# Patient Record
Sex: Female | Born: 1935 | Race: White | Hispanic: No | Smoking: Never smoker
Health system: Southern US, Community
[De-identification: ages and names within clinical notes are randomized; demographics above are authoritative.]

## PROBLEM LIST (undated history)

## (undated) DIAGNOSIS — E785 Hyperlipidemia, unspecified: Secondary | ICD-10-CM

## (undated) DIAGNOSIS — M1990 Unspecified osteoarthritis, unspecified site: Secondary | ICD-10-CM

## (undated) DIAGNOSIS — L97509 Non-pressure chronic ulcer of other part of unspecified foot with unspecified severity: Secondary | ICD-10-CM

## (undated) DIAGNOSIS — E119 Type 2 diabetes mellitus without complications: Secondary | ICD-10-CM

## (undated) DIAGNOSIS — I82409 Acute embolism and thrombosis of unspecified deep veins of unspecified lower extremity: Secondary | ICD-10-CM

## (undated) HISTORY — DX: Non-pressure chronic ulcer of other part of unspecified foot with unspecified severity: L97.509

## (undated) HISTORY — PX: BREAST BIOPSY: SHX20

## (undated) HISTORY — PX: CATARACT EXTRACTION: SUR2

## (undated) HISTORY — PX: BLADDER SURGERY: SHX569

## (undated) HISTORY — DX: Type 2 diabetes mellitus without complications: E11.9

## (undated) HISTORY — DX: Unspecified osteoarthritis, unspecified site: M19.90

## (undated) HISTORY — DX: Hyperlipidemia, unspecified: E78.5

## (undated) HISTORY — PX: URETERAL EXPLORATION: SHX2609

---

## 1965-12-14 HISTORY — PX: ABDOMINAL HYSTERECTOMY: SHX81

## 1983-12-15 HISTORY — PX: OTHER SURGICAL HISTORY: SHX169

## 2004-11-13 ENCOUNTER — Ambulatory Visit: Payer: Self-pay | Admitting: Internal Medicine

## 2005-07-09 ENCOUNTER — Ambulatory Visit: Payer: Self-pay | Admitting: Ophthalmology

## 2005-07-15 ENCOUNTER — Ambulatory Visit: Payer: Self-pay | Admitting: Ophthalmology

## 2005-11-18 ENCOUNTER — Ambulatory Visit: Payer: Self-pay | Admitting: Internal Medicine

## 2006-07-10 ENCOUNTER — Emergency Department: Payer: Self-pay | Admitting: Emergency Medicine

## 2006-07-10 ENCOUNTER — Other Ambulatory Visit: Payer: Self-pay

## 2006-11-29 ENCOUNTER — Ambulatory Visit: Payer: Self-pay | Admitting: Internal Medicine

## 2007-03-22 ENCOUNTER — Ambulatory Visit: Payer: Self-pay | Admitting: Gastroenterology

## 2007-07-04 ENCOUNTER — Other Ambulatory Visit: Payer: Self-pay

## 2007-07-04 ENCOUNTER — Ambulatory Visit: Payer: Self-pay | Admitting: Emergency Medicine

## 2007-07-04 ENCOUNTER — Emergency Department: Payer: Self-pay | Admitting: Emergency Medicine

## 2007-12-28 ENCOUNTER — Ambulatory Visit: Payer: Self-pay | Admitting: Family Medicine

## 2009-01-02 ENCOUNTER — Ambulatory Visit: Payer: Self-pay | Admitting: Family Medicine

## 2009-02-21 ENCOUNTER — Ambulatory Visit: Payer: Self-pay | Admitting: Family Medicine

## 2009-11-28 ENCOUNTER — Inpatient Hospital Stay: Payer: Self-pay | Admitting: Specialist

## 2010-01-07 ENCOUNTER — Ambulatory Visit: Payer: Self-pay | Admitting: Family Medicine

## 2010-05-08 ENCOUNTER — Ambulatory Visit: Payer: Self-pay | Admitting: Gastroenterology

## 2011-01-08 ENCOUNTER — Ambulatory Visit: Payer: Self-pay | Admitting: Family Medicine

## 2012-02-16 ENCOUNTER — Ambulatory Visit: Payer: Self-pay | Admitting: Internal Medicine

## 2012-02-22 DIAGNOSIS — Z7901 Long term (current) use of anticoagulants: Secondary | ICD-10-CM | POA: Insufficient documentation

## 2012-02-22 DIAGNOSIS — E559 Vitamin D deficiency, unspecified: Secondary | ICD-10-CM | POA: Insufficient documentation

## 2012-03-22 DIAGNOSIS — I999 Unspecified disorder of circulatory system: Secondary | ICD-10-CM | POA: Insufficient documentation

## 2012-12-14 HISTORY — PX: PARTIAL HIP ARTHROPLASTY: SHX733

## 2013-02-16 ENCOUNTER — Ambulatory Visit: Payer: Self-pay | Admitting: Internal Medicine

## 2013-04-25 DIAGNOSIS — I6529 Occlusion and stenosis of unspecified carotid artery: Secondary | ICD-10-CM | POA: Insufficient documentation

## 2013-04-26 ENCOUNTER — Ambulatory Visit: Payer: Self-pay | Admitting: Internal Medicine

## 2013-05-31 DIAGNOSIS — R739 Hyperglycemia, unspecified: Secondary | ICD-10-CM | POA: Insufficient documentation

## 2013-08-04 ENCOUNTER — Ambulatory Visit: Payer: Self-pay | Admitting: Orthopedic Surgery

## 2013-08-04 LAB — URINALYSIS, COMPLETE
Bacteria: NONE SEEN
Bilirubin,UR: NEGATIVE
Ketone: NEGATIVE
Leukocyte Esterase: NEGATIVE
Nitrite: NEGATIVE
Ph: 6 (ref 4.5–8.0)
RBC,UR: NONE SEEN /HPF (ref 0–5)
Specific Gravity: 1.01 (ref 1.003–1.030)

## 2013-08-04 LAB — PROTIME-INR
INR: 1.7
Prothrombin Time: 20.2 secs — ABNORMAL HIGH (ref 11.5–14.7)

## 2013-08-04 LAB — CBC
HCT: 36.5 % (ref 35.0–47.0)
HGB: 12.3 g/dL (ref 12.0–16.0)
MCHC: 33.7 g/dL (ref 32.0–36.0)
MCV: 94 fL (ref 80–100)
Platelet: 194 10*3/uL (ref 150–440)
RDW: 12.5 % (ref 11.5–14.5)
WBC: 9.4 10*3/uL (ref 3.6–11.0)

## 2013-08-04 LAB — COMPREHENSIVE METABOLIC PANEL
Albumin: 3.4 g/dL (ref 3.4–5.0)
Alkaline Phosphatase: 69 U/L (ref 50–136)
Anion Gap: 6 — ABNORMAL LOW (ref 7–16)
BUN: 27 mg/dL — ABNORMAL HIGH (ref 7–18)
Bilirubin,Total: 0.2 mg/dL (ref 0.2–1.0)
Calcium, Total: 9.3 mg/dL (ref 8.5–10.1)
Co2: 28 mmol/L (ref 21–32)
EGFR (African American): >60
Glucose: 114 mg/dL — ABNORMAL HIGH (ref 65–99)
Osmolality: 284 (ref 275–301)
Potassium: 4.7 mmol/L (ref 3.5–5.1)
SGOT(AST): 19 U/L (ref 15–37)
SGPT (ALT): 21 U/L (ref 12–78)
Sodium: 139 mmol/L (ref 136–145)

## 2013-08-05 ENCOUNTER — Inpatient Hospital Stay: Payer: Self-pay | Admitting: Internal Medicine

## 2013-08-05 LAB — PROTIME-INR
INR: 1.7
INR: 1.6
Prothrombin Time: 18.8 secs — ABNORMAL HIGH (ref 11.5–14.7)

## 2013-08-05 LAB — APTT: Activated PTT: 29.3 secs (ref 23.6–35.9)

## 2013-08-06 LAB — PROTIME-INR
INR: 1.3
Prothrombin Time: 16.2 secs — ABNORMAL HIGH (ref 11.5–14.7)

## 2013-08-06 LAB — CBC WITH DIFFERENTIAL/PLATELET
Basophil #: 0 10*3/uL (ref 0.0–0.1)
Basophil %: 0.3 %
Eosinophil %: 2.5 %
HCT: 34.4 % — ABNORMAL LOW (ref 35.0–47.0)
Lymphocyte #: 1.8 10*3/uL (ref 1.0–3.6)
Lymphocyte %: 16.7 %
MCH: 32.3 pg (ref 26.0–34.0)
MCHC: 33.9 g/dL (ref 32.0–36.0)
MCV: 96 fL (ref 80–100)
Neutrophil #: 7.3 10*3/uL — ABNORMAL HIGH (ref 1.4–6.5)
Neutrophil %: 67.9 %
Platelet: 156 10*3/uL (ref 150–440)
RBC: 3.6 10*6/uL — ABNORMAL LOW (ref 3.80–5.20)
RDW: 13.2 % (ref 11.5–14.5)
WBC: 10.8 10*3/uL (ref 3.6–11.0)

## 2013-08-06 LAB — BASIC METABOLIC PANEL
BUN: 14 mg/dL (ref 7–18)
Creatinine: 0.81 mg/dL (ref 0.60–1.30)
EGFR (Non-African Amer.): >60
Osmolality: 275 (ref 275–301)
Potassium: 4.2 mmol/L (ref 3.5–5.1)

## 2013-08-07 LAB — BASIC METABOLIC PANEL
Anion Gap: 4 — ABNORMAL LOW (ref 7–16)
BUN: 12 mg/dL (ref 7–18)
Calcium, Total: 8.5 mg/dL (ref 8.5–10.1)
Chloride: 103 mmol/L (ref 98–107)
Co2: 31 mmol/L (ref 21–32)
Creatinine: 0.75 mg/dL (ref 0.60–1.30)
EGFR (African American): >60
EGFR (Non-African Amer.): >60
Glucose: 96 mg/dL (ref 65–99)
Potassium: 4.1 mmol/L (ref 3.5–5.1)

## 2013-08-07 LAB — HEMOGLOBIN: HGB: 10.1 g/dL — ABNORMAL LOW (ref 12.0–16.0)

## 2013-08-07 LAB — PROTIME-INR
INR: 1.3
Prothrombin Time: 16.5 secs — ABNORMAL HIGH (ref 11.5–14.7)

## 2013-08-08 LAB — PROTIME-INR: Prothrombin Time: 19.2 secs — ABNORMAL HIGH (ref 11.5–14.7)

## 2013-08-08 LAB — PLATELET COUNT: Platelet: 128 x10 3/mm 3 — ABNORMAL LOW

## 2013-08-09 LAB — URINALYSIS, COMPLETE
Bilirubin,UR: NEGATIVE
Glucose,UR: NEGATIVE mg/dL
Ketone: NEGATIVE
Leukocyte Esterase: NEGATIVE
Nitrite: NEGATIVE
Ph: 5
Protein: NEGATIVE
RBC,UR: 6 /HPF
Specific Gravity: 1.012
Squamous Epithelial: 4
WBC UR: 2 /HPF

## 2013-08-09 LAB — PROTIME-INR
INR: 1.7
Prothrombin Time: 19.5 s — ABNORMAL HIGH

## 2013-08-09 LAB — PATHOLOGY REPORT

## 2013-08-09 LAB — HEMOGLOBIN: HGB: 8.8 g/dL — ABNORMAL LOW (ref 12.0–16.0)

## 2013-08-10 LAB — CBC WITH DIFFERENTIAL/PLATELET
Basophil #: 0.1 10*3/uL (ref 0.0–0.1)
Eosinophil #: 0.4 10*3/uL (ref 0.0–0.7)
Eosinophil %: 4.6 %
HCT: 24.7 % — ABNORMAL LOW (ref 35.0–47.0)
HGB: 8.6 g/dL — ABNORMAL LOW (ref 12.0–16.0)
Lymphocyte #: 1.9 10*3/uL (ref 1.0–3.6)
MCHC: 34.8 g/dL (ref 32.0–36.0)
MCV: 92 fL (ref 80–100)
Neutrophil #: 4.4 10*3/uL (ref 1.4–6.5)
Neutrophil %: 55.5 %
Platelet: 184 10*3/uL (ref 150–440)
RBC: 2.67 10*6/uL — ABNORMAL LOW (ref 3.80–5.20)
RDW: 12.4 % (ref 11.5–14.5)
WBC: 7.9 10*3/uL (ref 3.6–11.0)

## 2013-08-10 LAB — PROTIME-INR
INR: 2.2
Prothrombin Time: 24.3 secs — ABNORMAL HIGH (ref 11.5–14.7)

## 2013-08-14 LAB — CULTURE, BLOOD (SINGLE)

## 2013-10-03 DIAGNOSIS — S72001A Fracture of unspecified part of neck of right femur, initial encounter for closed fracture: Secondary | ICD-10-CM | POA: Insufficient documentation

## 2013-11-21 ENCOUNTER — Ambulatory Visit: Payer: Self-pay | Admitting: Family Medicine

## 2013-12-14 HISTORY — PX: OTHER SURGICAL HISTORY: SHX169

## 2014-02-25 ENCOUNTER — Emergency Department: Payer: Self-pay | Admitting: Emergency Medicine

## 2014-03-12 ENCOUNTER — Ambulatory Visit: Payer: Self-pay | Admitting: Nurse Practitioner

## 2014-04-03 DIAGNOSIS — S42309A Unspecified fracture of shaft of humerus, unspecified arm, initial encounter for closed fracture: Secondary | ICD-10-CM | POA: Insufficient documentation

## 2014-04-30 ENCOUNTER — Ambulatory Visit: Payer: Self-pay | Admitting: Family Medicine

## 2014-06-21 ENCOUNTER — Ambulatory Visit: Payer: Self-pay | Admitting: Family Medicine

## 2014-06-27 ENCOUNTER — Encounter: Payer: Self-pay | Admitting: General Surgery

## 2014-07-02 ENCOUNTER — Encounter: Payer: Self-pay | Admitting: General Surgery

## 2014-07-02 ENCOUNTER — Ambulatory Visit (INDEPENDENT_AMBULATORY_CARE_PROVIDER_SITE_OTHER): Payer: Medicare HMO | Admitting: General Surgery

## 2014-07-02 VITALS — BP 130/72 | HR 76 | Resp 14 | Wt 187.0 lb

## 2014-07-02 DIAGNOSIS — I872 Venous insufficiency (chronic) (peripheral): Secondary | ICD-10-CM

## 2014-07-02 DIAGNOSIS — I83893 Varicose veins of bilateral lower extremities with other complications: Secondary | ICD-10-CM

## 2014-07-02 NOTE — Progress Notes (Signed)
Patient ID: Molly Hawkins, female   DOB: [birth year], 78 y.o.   MRN: [id number]  Chief Complaint  Patient presents with  . Other    left leg pain    HPI Molly Hawkins is a 78 y.o. female here today for left leg pain for about 1 month. Patient states she broke her arm 4 months ago. She states she has been having troubling with her left leg swelling.Patient states she had a ultrasound on 06/21/14 left leg. She states she wear compassion stockings daily.Patient has been on warfarin for 30 years now.She was treated 30 years ago to due blood clots when she broke her ankle.  HPI  Past Medical History  Diagnosis Date  . Diabetes mellitus without complication   . Hyperlipidemia   . Arthritis   . Ulcer of foot     Past Surgical History  Procedure Laterality Date  . Abdominal hysterectomy  1967  . Broken ankle  1985  . Cataract extraction  [id number]  . Partial hip arthroplasty  2014  . Broken arm Left 2015    History reviewed. No pertinent family history.  Social History History  Substance Use Topics  . Smoking status: Never Smoker   . Smokeless tobacco: Never Used  . Alcohol Use: No    Allergies  Allergen Reactions  . Codeine     Stomach problems    Current Outpatient Prescriptions  Medication Sig Dispense Refill  . CALCIUM CITRATE-VITAMIN D3 PO Take 1 tablet by mouth daily at 6 (six) AM.      . lisinopril (PRINIVIL,ZESTRIL) 20 MG tablet Take 1 tablet by mouth daily.      . vitamin B-12 (CYANOCOBALAMIN) 100 MCG tablet Take 100 mcg by mouth daily.      Marland Kitchen warfarin (COUMADIN) 3 MG tablet Take 1 tablet by mouth 4 (four) times a week.      . warfarin (COUMADIN) 4 MG tablet Take 4 mg by mouth 3 (three) times a week.       No current facility-administered medications for this visit.    Review of Systems Review of Systems  Constitutional: Negative.   Cardiovascular: Positive for leg swelling. Negative for chest pain and palpitations.    Blood pressure 130/72, pulse 76,  resp. rate 14, weight 187 lb (84.823 kg).  Physical Exam Physical Exam  Constitutional: She is oriented to person, place, and time. She appears well-developed and well-nourished.  Eyes: Conjunctivae are normal. No scleral icterus.  Neck: Neck supple.  Cardiovascular: Normal rate, regular rhythm and normal heart sounds.   Pulses:      Dorsalis pedis pulses are 1+ on the right side, and 0 on the left side.       Posterior tibial pulses are 2+ on the right side, and 2+ on the left side.  Left leg mildly tender and pitting edema . Less on right leg. Right calf VV. Statistics changes left leg above ankle inner asp  With tenderness and induration       Pulmonary/Chest: Effort normal and breath sounds normal.  Abdominal: Soft. Bowel sounds are normal. There is no tenderness.  Lymphadenopathy:    She has no cervical adenopathy.    She has no axillary adenopathy.       Right: No inguinal adenopathy present.       Left: No inguinal adenopathy present.  Neurological: She is alert and oriented to person, place, and time.  Skin: Skin is warm and dry.    Data Reviewed Venous Duplex study-  clots in distal left FV, likely old.  Assessment    Do not think pt has new clot in left leg, given prior history of DVT, pt anticoagulated. Findings are consistent with chronic venous insufficiency.    Plan    Patient to contine to use her compassion stockings. Patient to return on six week will repeat US to assess for reflux.           Zeeshan Korte G 07/02/2014, 8:17 PM

## 2014-07-02 NOTE — Patient Instructions (Addendum)
Patient to contine to use her compassion stockings. Patient to return on six week ultrasound. Recommended use of udder cream to leg to minimize stasis changes.

## 2014-07-11 ENCOUNTER — Ambulatory Visit: Payer: Self-pay | Admitting: Internal Medicine

## 2014-07-11 LAB — CBC CANCER CENTER
BASOS ABS: 0.1 x10 3/mm (ref 0.0–0.1)
Basophil %: 1 %
Eosinophil #: 0.3 x10 3/mm (ref 0.0–0.7)
Eosinophil %: 4.5 %
HCT: 39.7 % (ref 35.0–47.0)
HGB: 12.9 g/dL (ref 12.0–16.0)
LYMPHS ABS: 3.4 x10 3/mm (ref 1.0–3.6)
Lymphocyte %: 47.6 %
MCH: 30 pg (ref 26.0–34.0)
MCHC: 32.4 g/dL (ref 32.0–36.0)
MCV: 93 fL (ref 80–100)
MONO ABS: 0.8 x10 3/mm (ref 0.2–0.9)
Monocyte %: 11.3 %
NEUTROS ABS: 2.6 x10 3/mm (ref 1.4–6.5)
NEUTROS PCT: 35.6 %
PLATELETS: 197 x10 3/mm (ref 150–440)
RBC: 4.29 10*6/uL (ref 3.80–5.20)
RDW: 14.1 % (ref 11.5–14.5)
WBC: 7.2 x10 3/mm (ref 3.6–11.0)

## 2014-07-11 LAB — PROTIME-INR
INR: 2.2
Prothrombin Time: 24 secs — ABNORMAL HIGH (ref 11.5–14.7)

## 2014-07-14 ENCOUNTER — Ambulatory Visit: Payer: Self-pay | Admitting: Internal Medicine

## 2014-08-13 ENCOUNTER — Ambulatory Visit: Payer: Medicare HMO | Admitting: General Surgery

## 2014-08-14 ENCOUNTER — Ambulatory Visit: Payer: Self-pay | Admitting: Internal Medicine

## 2014-08-30 ENCOUNTER — Encounter: Payer: Self-pay | Admitting: *Deleted

## 2014-10-04 DIAGNOSIS — C44310 Basal cell carcinoma of skin of unspecified parts of face: Secondary | ICD-10-CM | POA: Insufficient documentation

## 2014-10-15 ENCOUNTER — Encounter: Payer: Self-pay | Admitting: General Surgery

## 2015-04-05 NOTE — Discharge Summary (Signed)
PATIENT NAME:  Molly Hawkins, Molly Hawkins MR#:  [id number] DATE OF BIRTH:  [birth year]  DATE OF ADMISSION:  08/05/2013 DATE OF DISCHARGE:  08/09/2013  ADMITTING DIAGNOSIS: Displaced right femoral neck fracture   DISCHARGE DIAGNOSES:   1.  Displaced right femoral neck fracture, status post right hip hemiarthroplasty on the 08/06/2013 by Dr. Tamala Julian.  2.  Coagulopathy due to Coumadin status post reversal for operation with fresh frozen plasma.  3.  Acute respiratory failure, postoperatively, likely aspiration. Questionble pneumonia. No pulmonary embolism on CT. Now on 2 liters of oxygen through nasal cannula.  4.  Acute posthemorrhagic anemia noted postoperatively with hemoglobin level of 8.8.  5.  History of hypertension. 6.  Diabetes mellitus, diet controlled. 7.  History of left lower extremity deep vein thrombosis now being loaded back with Coumadin with INR of 1.7 today on the 08/09/2013. 8.  History of arthritis.   DISCHARGE CONDITION: Stable.   DISCHARGE MEDICATIONS: The patient is to continue her usual medications.  1.  Coumadin 4 mg p.o. on Sundays, Mondays, Tuesdays, Thursdays, Fridays as well as Saturdays and 3 mg on Wednesdays and follow her pro time daily and adjusting Coumadin depending on the need.  2.  Lisinopril 20 mg p.o. daily. Better to be given at nighttime due to relative hypotension.  3.  Acetaminophen 500 mg p.o. every 4 hours as needed.  4.  Oxycodone 5 mg every 4 hours as needed.  5.  Nystatin topical powder to affected area 3 times daily.  6.  Docusate senna 50/8.6 mg 1 tablet twice a day.  7.  Bisacodyl 10 mg rectally 1 suppository once daily as needed.  8.  Levofloxacin 250 mg p.o. daily for 6 more days.  9.  Home oxygen with 2 liters of oxygen through nasal cannula in portable tank.   DIET: 2 grams salt, low fat, low cholesterol, carbohydrate-controlled diet, regular consistency.   ACTIVITY LIMITATIONS: As tolerated.  Physical therapy per surgery.   FOLLOWUP  APPOINTMENTS:   (Dictation Anomaly) SNF  [facility] in 2 days after discharge as well as [facility] in 1 to 2 weeks after discharge.   CONSULTANTS:  Medical service, care management, social work, Reche Dixon, [place], Dr. Mali Smith, [facility] ortho.    RADIOLOGIC STUDIES: Chest x-ray, 1 view on the 08/04/2013 showed no acute cardiopulmonary disease. No acute bony abnormality, no pneumothorax. Right hip complete x-ray 08/04/2013, showed angulated right femoral neck fracture. Pelvis AP only 08/04/2013, showed right femoral neck angulated fracture. Pelvic AP only 08/06/2013, postoperatively in PACU, newly placed prosthetic right hip joint. Further interpretation was deferred to Dr. Mali Smith. Chest, portable single view 08/07/2013, showed no acute disease of the chest. CT scan of the chest with IV contrast 08/09/2023, showed no CT evidence of pulmonary arterial embolic disease. Findings consistent with interstitial lung disease changes at the lung bases, atelectasis versus infiltrates at lung bases. Trace bilateral effusions were also noted.   HISTORY OF PRESENT ILLNESS: The patient is 79 year old Caucasian female with past medical history significant for history of diabetes mellitus diet controlled, history of hypertension, as well as DVT who was on Coumadin therapy at home, presents to the hospital after a fall with right hip pain. Please refer to Dr. Mali Smith's admission on 08/04/2013. On arrival to the Emergency Room, the patient was noted to have displaced right femoral neck fracture.  Dr. Mali Smith saw the patient in consultation and recommended to admit the patient to the hospitalist services due to hip fracture and undergo  right hemiarthroplasty. As the patient's INR was found to be 1.7, Dr. Tamala Julian felt that he would be able to proceed to operating room as soon as the patient's INR is controlled, and below 1.5. The patient was admitted to the hospital for further evaluation and treatment.   LABORATORY DATA: Initial labs  on 08/04/2013 showed mild elevation of BUN to 27, glucose 114, otherwise BMP was unremarkable. Liver enzymes were normal. Cardiac enzymes were not done. CBC was within normal limits.  INR as mentioned above was 1.7. Urinalysis was unremarkable. EKG showed normal sinus rhythm at 69 beats per minute, possible left atrial enlargement, ST elevation. Consider early repolarization according to EKG criteria. Borderline EKG, but no significant change since December 2010.   HOSPITAL COURSE:  The patient was given fresh frozen plasma 1 unit after which the patient's pro time improved to 1.3 and the patient was taken to the operating room on the 08/06/2013 where she underwent right hip hemiarthroplasty under spinal anesthesia by Dr. Mali Smith due to displaced right femoral neck fracture. Postoperatively she did well; however, she was noted to be hypoxic on 08/07/2013 with oxygen saturations as low as 50s to 70s on room air. She was placed on oxygen therapy. Chest x-ray were done. Chest x-ray was unremarkable; however, the patient had a CT scan of her chest done to rule out pulmonary embolism which showed atelectasis versus pneumonia as well as trace bilateral effusions.  IV fluids were stopped and the patient was started on incentive spirometry as well as antibiotic therapy. She, however, did not have any significant changes such as fevers or sputum production during all her stay in the hospital.     1.  In regards to a hip fracture, the patient is to continue her physical therapy according to [facility] ortho.  Discharge recommendations: The patient is to continue increasing weight-bearing on the affected extremity.  Change dressing once daily or as needed. The patient is to follow up with [facility] orthopedics in the next few weeks.  After procedure, the patient had her Coumadin resumed and the patient was continued on Lovenox twice a day. She is being discharged on Coumadin therapy, which should be resumed at her home doses. Her pro  time today on 08/ 27/2014, is 19.5 with INR of 1.7. It is recommended to follow the patient's pro time levels daily and adjust the Coumadin therapy as needed to reach a level of 2.0 and above.   2.  In regards to acute respiratory failure. The patient is to continue antibiotic therapy as well as incentive spirometry. She is to continue oxygen therapy and wean her off oxygen as tolerated to keep her pulse oximetry around 92% and above.  3. The patient was noted to have acute posthemorrhagic anemia. Postoperatively her hemoglobin level dropped down from admission level of 12.3 to 8.8 on 08/08/2013. It remained; however, stable by the 08/09/2013. It is recommended to continue observing her hemoglobin level and initiated her on iron supplementation if needed. 4.  For history of hypertension, the patient was relatively hypotensive while she was in the hospital; however on the day of discharge her blood pressure is around 120 to 127.  We recommended to restart her usual doses of lisinopril upon discharge.  5.  In regards to diabetes mellitus, the patient claimed that her hemoglobin A1c was around 6. She is to continue diabetic diet for now.  6.  History of deep vein thrombosis. The patient is as mentioned above being loaded with Coumadin  again. Her INR is 1.7. It is recommended to follow her pro time and adjust medications.  7. Arthritis. She is to continue pain management.   She is being discharged in stable condition with the above-mentioned medications as well as followup.   VITAL SIGNS:  Temperature is 97.8, pulse is 75, respirations 18, blood pressure 124/72, saturation was 92% on 2 liters of oxygen through nasal cannula.    TIME SPENT: 40 minutes.   ____________________________ Theodoro Grist, MD rv:dp D: 08/09/2013 08:54:00 ET T: 08/09/2013 09:37:19 ET JOB#: [id number]  cc: [facility] Theodoro Grist, MD, <Dictator>     [place] MD ELECTRONICALLY SIGNED 09/02/2013 18:11

## 2015-04-05 NOTE — Consult Note (Signed)
Brief Consult Note: Diagnosis: Right femur fracture.   Patient was seen by consultant.   Consult note dictated.   Recommend to proceed with surgery or procedure.   Recommend further assessment or treatment.   Orders entered.   Discussed with Attending MD.   Comments: 1. Right femur fracture Mechanical fall. Low risk for surgery with good functional status. Have to make sure INR is followed up tomorrow to keep it < 1.5. Hold coumadin.  2. h/o DVT Lovenox after surgery  FULL CODE.  Electronic Signatures: Alba Destine (MD)  (Signed [phone] 21:49)  Authored: Brief Consult Note   Last Updated: 22-Aug-14 21:49 by Alba Destine (MD)

## 2015-04-05 NOTE — Discharge Summary (Signed)
PATIENT NAME:  Molly Hawkins, Molly Hawkins MR#:  [id number] DATE OF BIRTH:  [birth year]  DATE OF ADMISSION:  08/05/2013 DATE OF DISCHARGE:   08/10/2013  Addendum  The patient was about to be discharged on  08/09/2013; however, she was noticed to have a high fever, as high as 102.4 on 08/08/2013, at around 10:00 p.m., so further investigation of this fever was undertaken as the patient was held in the hospital. Blood cultures were performed on 08/09/2013, and those were negative. No growth in 8 to 12 hours. Urinalysis was also done, which showed yellow, hazy urine. Negative for glucose, bilirubin or ketones. Specific gravity was 1.012, pH was 5.0, 1+ blood, negative for protein, nitrites or leukocyte esterase, 6 red blood cells, 2 white blood cells, 1+ bacteria, 4 epithelial cells as well as mucus was present. The patient also had a chest x-ray performed on 08/09/2013, which showed no evidence of acute cardiopulmonary disease. It was unclear why the patient was having fevers; however, those fevers have not recurred and so far all workup was negative. It was felt that the patient would be safe to be discharged on Levaquin to continue for suspected aspiration versus atelectasis in her lungs. The patient, however, remains asymptomatic in regards to that, except her hypoxia. Her hypoxia, however, is improving with incentive spirometry as well as Levaquin. On the day of discharge, 08/10/2013, the patient's temperature is 98, pulse was 80, respiration rate was 18, blood pressure 127/65, saturation was 97% on 2 liters of oxygen through nasal cannula. The patient is stable to discharge home today.   In regards to coagulopathy, (Dictation Anomaly) DVT, the patient was loaded with Coumadin. The patient's INR today is therapeutic at 2.2. (Dictation Anomaly) Since her protime is therapeutic, recommend to continue her usual home Coumadin doses and follow her Coumadin level per protocol.   In regards to hypoxia, acute respiratory  failure, it was felt to be due to some element of aspiration as well as atelectasis, questionable pneumonia. The patient is to continue antibiotic therapy as well as incentive spirometry. No pulmonary embolism was noted on the CT scan. The  patient was also noted to be anemic; however, the patient's anemia remains stable on Coumadin therapy. On the day of discharge, 08/10/2013, the patient's hemoglobin level is 8.6, which is about the same as it has been since 08/08/2013.    For diabetes mellitus, the patient's diabetes was diet controlled. The patient is to continue diet.  For hypertension, the patient is to resume her usual doses of lisinopril upon discharge. It is recommended to follow the patient's blood pressure readings as outpatient and make decisions about advancement of lowering the dose of lisinopril. On the day of discharge, the patient's blood pressure is fluctuating between 741 to 287 systolic.   The patient is being discharged in stable condition with the above-mentioned medications and followup.   TIME SPENT: 40 minutes.   ____________________________ Theodoro Grist, MD rv:dmm D: 08/10/2013 10:01:00 ET T: 08/10/2013 10:24:21 ET JOB#: [id number]  cc: Theodoro Grist, MD, <Dictator> Mykeisha Dysert MD ELECTRONICALLY SIGNED 09/06/2013 20:32

## 2015-04-05 NOTE — Consult Note (Signed)
PATIENT NAME:  Molly Hawkins, ZWILLING MR#:  [id number] DATE OF BIRTH:  [birth year]  DATE OF CONSULTATION:  08/04/2013  CONSULTING PHYSICIAN:  Sheilyn Boehlke R. Cali Cuartas, MD   PRIMARY CARE PHYSICIAN:  Valentino Saxon.   CHIEF COMPLAINT: Fall, right hip pain.   HISTORY OF PRESENTING ILLNESS: A 79 year old female patient with history of DVT on Coumadin, hypertension. Was walking barefoot in her home, tripped over a mat and fell down, landing on her right hip. She has an angulated right hip fracture. The patient is being admitted by orthopedic surgeon and preop evaluation and medical management has been requested.   The patient presently complains of right hip pain. Did not hit her head. No loss of consciousness. No syncope, palpitations, shortness of breath, chest pain. She has good functional status. Walks about 30 minutes every day without any shortness of breath or chest pain. She had a normal stress test in 2007. EKG does not show any acute ST-T wave changes. Her INR level is 1.7. She did not take her Coumadin today. CBC and BMP are in the normal range. I have discussed the case with the orthopedic surgeon and ER physician.   PAST MEDICAL HISTORY: 1.  History of left lower extremity DVT, on Coumadin since 1995.  2.  Hypertension.  3.  Diet-controlled diabetes mellitus. The patient did lose a significant amount of weight.  4.  Arthritis.   FAMILY HISTORY: Reviewed, unknown.   ALLERGIES: CILOXAN AND CODEINE.    HOME MEDICATIONS: Include:  1.  Norvasc 5 mg oral daily.  2.  Lisinopril 20 mg daily.  3.  Coumadin 5 mg daily.     REVIEW OF SYSTEMS: CONSTITUTIONAL: No fatigue, weakness, weight loss, weight gain.  EYES: No blurred vision, pain or redness.  ENT: No tinnitus, ear pain, hearing loss.  RESPIRATORY: No cough, wheezing,  hemoptysis.  CARDIOVASCULAR: No chest pain, orthopnea, edema.  GASTROINTESTINAL No nausea, vomiting, diarrhea, abdominal pain.  GENITOURINARY: No dysuria, hematuria, frequency.   ENDOCRINE: No polyuria, nocturia or thyroid problems.  HEMATOLOGIC AND LYMPHATIC: No anemia, easy bruising or bleeding.  INTEGUMENTARY: No acne, rash, lesions.  MUSCULOSKELETAL: Has right hip pain.  NEUROLOGIC: No focal numbness or seizures.  PSYCHIATRIC:  No anxiety or depression.   SOCIAL HISTORY: The patient ambulates on her own. Does not smoke. No alcohol. No illicit drugs. Lives alone.   PHYSICAL EXAMINATION: VITAL SIGNS:  Shows temperature 97.7, pulse 72, blood pressure 158/68, saturating 99% on room air.  GENERAL: Obese, Caucasian female patient, lying in bed, in mild distress secondary to right hip pain.  PSYCHIATRIC: Alert and oriented x 3. Mood and affect appropriate. Judgment intact.  HEENT: Atraumatic, normocephalic. Oral mucosa moist and pink. External ears and nose normal. No pallor. No icterus. Pupils bilaterally equal and reactive to light.  NECK: Supple. No thyromegaly. No palpable lymph nodes. Trachea midline. No carotid bruit, JVD.  CARDIOVASCULAR: S1, S2, without any murmurs. Peripheral pulses 2+.  RESPIRATORY: Normal work of breathing. Clear to auscultation on both sides.  GASTROINTESTINAL:  Soft abdomen, nontender. Bowel sounds present. No hepatosplenomegaly palpable. SKIN: Warm and dry. No petechiae, rash, ulcers.  GENITOURINARY: No CVA tenderness or bladder distention.  MUSCULOSKELETAL: Has tenderness on palpation of the right hip. No swelling. Rotated. No other joint swelling or redness found. Normal muscle tone.  NEUROLOGICAL: Motor strength 5 out of 5 in upper and lower extremities. Sensation to fine touch intact all over. Cranial nerves II through XII intact.  LYMPHATIC: No cervical, supraclavicular lymphadenopathy.   LABORATORY,  DIAGNOSTIC AND RADIOLOGIC DATA:  Show glucose 114, BUN 27, creatinine 0.91, sodium 139, potassium 4.7. AST, ALT, alkaline phosphatase and bilirubin normal. WBC 9.4, hemoglobin 12.3, platelets of 194. Blood group is A negative. INR 1.7.  Urinalysis shows no bacteria.   EKG shows normal sinus rhythm, mild repolarization changes.   Hip x-ray shows right angulated femur fracture.   ASSESSMENT AND PLAN: 1.  Right femur fracture. The patient should be a low risk for surgery with a good functional status, as long as her INR is less than 1.5.  We will repeat an INR tomorrow morning. Reviewed EKG.  Also normal stress test in 2007. Had a mechanical fall today. We will watch our for any acute blood loss anemia. The patient will be on incentive spirometry.  2.  Uncontrolled hypertension, likely secondary from the pain. We will continue her home medications. Add IV hydralazine p.r.n.  3.  Diet-controlled diabetes mellitus. The patient's blood sugar seems to be well controlled. She mentions that HbA1c has been running between 5.5 to 6 at home.  4.  History of deep vein thrombosis. Will need Lovenox after surgery.   CODE STATUS: Full code.   Time Spent today on this case was 45 minutes.    ____________________________ Leia Alf Kori Colin, MD srs:dmm D: 08/04/2013 22:00:00 ET T: 08/04/2013 22:27:34 ET JOB#: [id number]  cc: Alveta Heimlich R. Thatiana Renbarger, MD, <Dictator> Maureen P. Heber [place], MD Neita Carp MD ELECTRONICALLY SIGNED 08/17/2013 8:03

## 2015-04-05 NOTE — H&P (Signed)
PATIENT NAME:  Molly Hawkins, Molly Hawkins MR#:  [id number] DATE OF BIRTH:  [birth year]  DATE OF ADMISSION:  08/04/2013  CHIEF COMPLAINT: Right hip pain.   HISTORY OF PRESENT ILLNESS: This is a 79 year old female who fell today and sustained injury to her right hip. She had immediate pain as well as deformity. She presented to the Emergency Department with pain rated as an 8/10, sharp in nature, exacerbated by movement of the lower extremity and relieved by rest. She denies any loss of consciousness. She did not strike her head. She did not injure her other extremities except for a small abrasion on her right elbow.   PAST MEDICAL HISTORY: Hypertension, multiple DVTs.   PAST SURGICAL HISTORY: Left ankle ORIF.   MEDICATIONS: Norvasc, lisinopril, Coumadin.   ALLERGIES: CILOXAN, CODEINE.   SOCIAL HISTORY: Denies any alcohol or tobacco use.   FAMILY HISTORY: Noncontributory.   REVIEW OF SYSTEMS: No chest pain. No shortness of breath.   PHYSICAL EXAMINATION:  GENERAL: Well appearing, well nourished, no acute distress.  RIGHT UPPER EXTREMITY: Examination of the right elbow reveals overlying skin to be intact. There is a small abrasion at the tip of the olecranon. She has full range of motion, good strength. RIGHT LOWER EXTREMITY: Examination of the right hip reveals overlying skin to be intact with no erythema but some mild ecchymosis. She has tenderness to palpation over the greater trochanter. She has full range of motion of the ankle, 5/5 strength on dorsiflexion, plantar flexion and EHL function. Intact sensation distally, good distal pulses. She has no knee or ankle instability.  LEFT LOWER EXTREMITY: Examination of the left lower extremity was performed in a similar manner and was free of any abnormalities.   IMAGES: X-rays of the pelvis and right hip were obtained and reviewed which reveal evidence of a displaced right femoral neck fracture.   IMPRESSION: A 79 year old female with a displaced right  femoral neck fracture.   PLAN: The patient will be admitted to the hospitalist service due to her hip fracture. She will undergo right hip hemiarthroplasty tomorrow pending clearance. Her INR is 1.7 right now, and we would like it to be below 1.5. She will be n.p.o. after midnight. All questions were answered.    ____________________________ Mali E. Jonhatan Hearty, MD ces:np D: 08/04/2013 23:02:52 ET T: 08/04/2013 23:20:30 ET JOB#: [id number]  cc: [place] E. Brendalyn Vallely, MD, <Dictator> Mali E Aaryav Hopfensperger MD ELECTRONICALLY SIGNED 08/05/2013 10:07

## 2015-04-05 NOTE — Op Note (Signed)
PATIENT NAME:  Molly Hawkins, Molly Hawkins MR#:  [id number] DATE OF BIRTH:  16-Aug-1936  DATE OF PROCEDURE:  08/06/2013  PREOPERATIVE DIAGNOSIS: Displaced right femoral neck fracture.   POSTOPERATIVE DIAGNOSIS: Displaced right femoral neck fracture.  PROCEDURE: Right hip hemiarthroplasty.   IMPLANTS: Biomet 12.5 stem with a 49 mm outer diameter bipolar head on a +6 neck.   SURGEON: Molly Yardley Beltran, MD   ANESTHESIA: Spinal.   FLUIDS: Per anesthesia record.   BLOOD LOSS: 200.   URINE: 150.  COMPLICATIONS: None.   SPECIMENS: Femoral head x 1.   INDICATIONS: This is a 79 year old female who fell and sustained injury to her right hip. She was found to have a displaced right femoral neck fracture. She was indicated for operative intervention in the form of a hemiarthroplasty. 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 The patient was admitted to the hospitalist team who treated her elevated INR with FFP; however, vitamin K was also required on hospital day #1, and once her INR was below 1.5 she was then cleared for surgical intervention. All risks, benefits and alternatives of the procedure were explained at length to the patient, and she wished to proceed. Informed consent was obtained.   PROCEDURE: The patient was seen in the preoperative holding area where the operative site was marked by an operative surgeon. Preoperative antibiotics were administered. The patient was taken to the operating room where a spinal anesthesia was induced. She was then flipped lateral with the right hip up on a pegboard. All bony prominences were well padded. The pelvis was secured. The right hip was isolated. It was prepped and draped in the usual sterile manner, and a surgical timeout was performed. To begin, we made a standard posterior approach to the hip, dissected down to the IT band which was split in line with the incision. Short external rotators were  identified. The piriformis was released and tagged. The capsule was encountered and T'd. In order to optimize our visualization, the incision was extended both proximally and distally. A preliminary neck cut was made. We then removed the femoral head with a corkscrew. There was no remaining debris. A box osteotome was utilized, as was a canal finder and a lateralizing reamer. Once these steps were complete, we began broaching. We did this up to a size 12.5 This allowed for good fit and feel. We trialed the +3, followed by the +6, which allowed for appropriate stability and equivalent leg length. The final stem was inserted without difficulty. The head had been sized appropriately, and the bipolar components were assembled on the back table and then implanted and secured appropriately on the femoral stem. The hip was reduced. There was appropriate stability. The wounds were thoroughly irrigated. The capsule was closed with #2 Ticron. Piriformis was also repaired with #2 Ticron. The IT band was closed with 0 Vicryl as well as 0 Quill suture. The skin was closed with 0 Vicryl, followed by 2-0 Vicryl and staples. Sterile dressings were applied. Drapes were removed. An abduction pillow was placed. The patient was awakened from light sedation without complication and transported to the recovery room in stable condition.   POSTOPERATIVE PLAN: The patient will be weightbearing as tolerated. She will be given Lovenox for DVT prophylaxis. She will also resume her Coumadin. She will receive postoperative antibiotics.   ____________________________ Molly E. Malani Lees, MD ces:cb D: 08/06/2013 21:36:54 ET T: 08/06/2013 21:46:12 ET JOB#: [id number]  cc: Molly  Christain Sacramento, MD, <Dictator> Molly E Fernand Sorbello MD ELECTRONICALLY SIGNED 08/10/2013 9:23

## 2015-04-09 ENCOUNTER — Other Ambulatory Visit: Payer: Self-pay

## 2015-04-09 DIAGNOSIS — Z1231 Encounter for screening mammogram for malignant neoplasm of breast: Secondary | ICD-10-CM

## 2015-05-02 ENCOUNTER — Ambulatory Visit
Admission: RE | Admit: 2015-05-02 | Discharge: 2015-05-02 | Disposition: A | Discharge status: Home / Self Care | Payer: PPO | Source: Ambulatory Visit | Attending: Family Medicine | Admitting: Family Medicine

## 2015-05-02 DIAGNOSIS — Z1231 Encounter for screening mammogram for malignant neoplasm of breast: Secondary | ICD-10-CM | POA: Diagnosis present

## 2015-05-24 ENCOUNTER — Encounter: Payer: Self-pay | Admitting: Urology

## 2015-05-24 ENCOUNTER — Ambulatory Visit (INDEPENDENT_AMBULATORY_CARE_PROVIDER_SITE_OTHER): Payer: PPO | Admitting: Urology

## 2015-05-24 VITALS — BP 171/83 | HR 73 | Ht 64.0 in | Wt 187.8 lb

## 2015-05-24 DIAGNOSIS — K5902 Outlet dysfunction constipation: Secondary | ICD-10-CM | POA: Diagnosis not present

## 2015-05-24 DIAGNOSIS — N8110 Cystocele, unspecified: Secondary | ICD-10-CM | POA: Diagnosis not present

## 2015-05-24 DIAGNOSIS — E7800 Pure hypercholesterolemia, unspecified: Secondary | ICD-10-CM | POA: Insufficient documentation

## 2015-05-24 DIAGNOSIS — E538 Deficiency of other specified B group vitamins: Secondary | ICD-10-CM | POA: Insufficient documentation

## 2015-05-24 DIAGNOSIS — J301 Allergic rhinitis due to pollen: Secondary | ICD-10-CM | POA: Insufficient documentation

## 2015-05-24 DIAGNOSIS — I10 Essential (primary) hypertension: Secondary | ICD-10-CM | POA: Insufficient documentation

## 2015-05-24 DIAGNOSIS — R350 Frequency of micturition: Secondary | ICD-10-CM

## 2015-05-24 LAB — URINALYSIS, COMPLETE
BILIRUBIN UA: NEGATIVE
Glucose, UA: NEGATIVE
Ketones, UA: NEGATIVE
Leukocytes, UA: NEGATIVE
Nitrite, UA: NEGATIVE
PROTEIN UA: NEGATIVE
RBC, UA: NEGATIVE
Specific Gravity, UA: 1.015 (ref 1.005–1.030)
Urobilinogen, Ur: 0.2 mg/dL (ref 0.2–1.0)
pH, UA: 5.5 (ref 5.0–7.5)

## 2015-05-24 LAB — MICROSCOPIC EXAMINATION: BACTERIA UA: NONE SEEN

## 2015-05-24 LAB — BLADDER SCAN AMB NON-IMAGING: Scan Result: 54

## 2015-05-24 NOTE — Patient Instructions (Signed)

## 2015-05-24 NOTE — Progress Notes (Signed)
05/24/2015 11:36 AM   Molly Hawkins 07/28/1936 [id number]  Referring provider: Gayland Curry, MD [address] [place], [place] [address]  Chief Complaint  Patient presents with  . Bladder Prolapse    New Patient    HPI: 79 yo F who presents today complaining that her bladder has "dropped".  She reports bulging out of her vagina which is quite bothersome.  She first started noticing this when she has having trouble with constipation secondary to opoid use and during PT after recent broken arm/ fall.  She does report urinary frequency and urgency which is longstanding.  She rarely has episodes of incontinence.  She also have sensation of incomplete bladder emptying at times.  No stress urinary incontinence.    She does report some baseline constipation.  She also sometimes strains to get her stool out.    No recent UTIs, flank pain, or gross hematuria.    She did have urological issues in her early 32s having several procedures by Dr. Ernst Spell including serial urethral dilations, some unclear bladder surgery (? Cut bladder to help structure with recurrence of strictures).   She does also have a history of endometriosis s/p hysterectomy and developed ?scar tissue involving her ureters s/p unknown ureteral procedure.      PMH: Past Medical History  Diagnosis Date  . Diabetes mellitus without complication   . Hyperlipidemia   . Arthritis   . Ulcer of foot     Surgical History: Past Surgical History  Procedure Laterality Date  . Abdominal hysterectomy  1967  . Broken ankle  1985  . Cataract extraction  [id number]  . Partial hip arthroplasty  2014  . Broken arm Left 2015  . Ureteral exploration  1960s    removed stricture/scar tissue  . Bladder surgery  1960s    Home Medications:    Medication List       This list is accurate as of: 05/24/15 11:59 PM.  Always use your most recent med list.               CALCIUM CITRATE-VITAMIN D3 PO  Take 1 tablet by mouth  daily at 6 (six) AM.     lisinopril 20 MG tablet  Commonly known as:  PRINIVIL,ZESTRIL  Take 1 tablet by mouth daily.     vitamin B-12 100 MCG tablet  Commonly known as:  CYANOCOBALAMIN  Take 100 mcg by mouth daily.     warfarin 4 MG tablet  Commonly known as:  COUMADIN  Take 4 mg by mouth 3 (three) times a week.     warfarin 3 MG tablet  Commonly known as:  COUMADIN  Take 1 tablet by mouth 4 (four) times a week.        Allergies:  Allergies  Allergen Reactions  . Atorvastatin     Other reaction(s): Unknown  . Codeine     Stomach problems  . Ezetimibe     Other reaction(s): Unknown  . Statins     Other reaction(s): Unknown    Family History: Family History  Problem Relation Age of Onset  . Bladder Cancer Son   . Diabetes Son     Social History:  reports that she has never smoked. She has never used smokeless tobacco. She reports that she does not drink alcohol or use illicit drugs.  ROS: Urological Symptom Review   Patient is experiencing the following symptoms: Frequent urination Hard to postpone urination Get up at night to urinate Stream starts and stops  Review of Systems  Gastrointestinal (upper)  : Negative for upper GI symptoms  Gastrointestinal (lower) : Negative for lower GI symptoms  Constitutional : Negative for symptoms  Skin: Negative for skin symptoms  Eyes: Negative for eye symptoms  Ear/Nose/Throat : Sinus problems  Hematologic/Lymphatic: Negative for Hematologic/Lymphatic symptoms  Cardiovascular : Negative for cardiovascular symptoms  Respiratory : Negative for respiratory symptoms  Endocrine: Negative for endocrine symptoms  Musculoskeletal: Negative for musculoskeletal symptoms  Neurological: Negative for neurological symptoms  Psychologic: Negative for psychiatric symptoms   Physical Exam: BP 171/83 mmHg  Pulse 73  Ht 5\' 4"  (1.626 m)  Wt 187 lb 12.8 oz (85.186 kg)  BMI 32.22 kg/m2    Constitutional:  Alert and oriented, No acute distress. HEENT: [place] AT, moist mucus membranes.  Trachea midline, no masses. Cardiovascular: No clubbing, cyanosis, or edema. Respiratory: Normal respiratory effort, no increased work of breathing. GI: Abdomen is soft, nontender, nondistended, no abdominal masses GU: No CVA tenderness. Pelvic exam shows normal external genitalia, atrophic vaginal mucosa.  Grade II/III rectocele.  Good anterior support with mild apical decent.  Widely patent urethral without hypermobility or demonstrable SUI.   Skin: No rashes, bruises or suspicious lesions. Lymph: No cervical or inguinal adenopathy. Neurologic: Grossly intact, no focal deficits, moving all 4 extremities. Psychiatric: Normal mood and affect.  Laboratory Data: Lab Results  Component Value Date   WBC 7.2 07/11/2014   HGB 12.9 07/11/2014   HCT 39.7 07/11/2014   MCV 93 07/11/2014   PLT 197 07/11/2014    Lab Results  Component Value Date   CREATININE 0.75 08/07/2013     Urinalysis Results for orders placed or performed in visit on 05/24/15  Microscopic Examination  Result Value Ref Range   WBC, UA 0-5 0 -  5 /hpf   RBC, UA 0-2 0 -  2 /hpf   Epithelial Cells (non renal) 0-10 0 - 10 /hpf   Bacteria, UA None seen None seen/Few  Urinalysis, Complete  Result Value Ref Range   Specific Gravity, UA 1.015 1.005 - 1.030   pH, UA 5.5 5.0 - 7.5   Color, UA Yellow Yellow   Appearance Ur Clear Clear   Leukocytes, UA Negative Negative   Protein, UA Negative Negative/Trace   Glucose, UA Negative Negative   Ketones, UA Negative Negative   RBC, UA Negative Negative   Bilirubin, UA Negative Negative   Urobilinogen, Ur 0.2 0.2 - 1.0 mg/dL   Nitrite, UA Negative Negative   Microscopic Examination See below:   Bladder Scan (Post Void Residual) in office  Result Value Ref Range   Scan Result 54     Pertinent Imaging: PVR 54 cc  Assessment & Plan:  79 year old female with a history of  constipation with symptoms of pelvic organ prolapse found to have a fairly significant rectocele. She has fairly good anterior support without evidence of stress urinary incontinence or incomplete bladder emptying.  1. female rectal prolapse  Significant rectocele, pelvic organ prolapse with valsala associated with constipation and difficulty evacuating her stool.  Her primary bother is related to the constipation rather than the bulging itself. We discussed various options including aggressive bowel management with conservative treatment for the pelvic organ prolapse, surgical repair versus pessary placement. Education was provided regarding the pathophysiology of her current symptoms. At this point, she would like to try to get her bowels under control and will defer pessary or rectocele repair for now. I did provide her with the names of several surgeons  to do perform rectocele repairs in the area should she elect to pursue this down the road.  2. Constipation due to outlet dysfunction I have advised the patient to start on Colace 100 mg twice a day and possibly additional Metamucil to soften her stools. She can use Dulcolax as needed to induce a bowel movements if she is not regular with the above regimen.    3. Urinary frequency Complains of baseline urinary urgency and frequency but no incontinence. Overall, she has minimal bother from this. She is not interested in medical treatment. We discussed avoidance of irritating beverages and other behavioral modification. - Urinalysis, Complete - Bladder Scan (Post Void Residual) in office - Microscopic Examination   Return if symptoms worsen or fail to improve.  Hollice Espy, MD  [facility] [address], [place], [place] [address] [phone]

## 2015-05-25 ENCOUNTER — Encounter: Payer: Self-pay | Admitting: Urology

## 2015-12-17 DIAGNOSIS — M9903 Segmental and somatic dysfunction of lumbar region: Secondary | ICD-10-CM | POA: Diagnosis not present

## 2015-12-17 DIAGNOSIS — M5441 Lumbago with sciatica, right side: Secondary | ICD-10-CM | POA: Diagnosis not present

## 2015-12-18 DIAGNOSIS — I824Y9 Acute embolism and thrombosis of unspecified deep veins of unspecified proximal lower extremity: Secondary | ICD-10-CM | POA: Diagnosis not present

## 2015-12-18 DIAGNOSIS — Z7901 Long term (current) use of anticoagulants: Secondary | ICD-10-CM | POA: Diagnosis not present

## 2016-01-10 DIAGNOSIS — M5441 Lumbago with sciatica, right side: Secondary | ICD-10-CM | POA: Diagnosis not present

## 2016-01-10 DIAGNOSIS — M9903 Segmental and somatic dysfunction of lumbar region: Secondary | ICD-10-CM | POA: Diagnosis not present

## 2016-01-17 DIAGNOSIS — I824Y9 Acute embolism and thrombosis of unspecified deep veins of unspecified proximal lower extremity: Secondary | ICD-10-CM | POA: Diagnosis not present

## 2016-01-17 DIAGNOSIS — Z7901 Long term (current) use of anticoagulants: Secondary | ICD-10-CM | POA: Diagnosis not present

## 2016-01-24 DIAGNOSIS — I824Y9 Acute embolism and thrombosis of unspecified deep veins of unspecified proximal lower extremity: Secondary | ICD-10-CM | POA: Diagnosis not present

## 2016-01-24 DIAGNOSIS — Z7901 Long term (current) use of anticoagulants: Secondary | ICD-10-CM | POA: Diagnosis not present

## 2016-02-07 DIAGNOSIS — I824Y9 Acute embolism and thrombosis of unspecified deep veins of unspecified proximal lower extremity: Secondary | ICD-10-CM | POA: Diagnosis not present

## 2016-02-07 DIAGNOSIS — Z7901 Long term (current) use of anticoagulants: Secondary | ICD-10-CM | POA: Diagnosis not present

## 2016-02-14 DIAGNOSIS — I824Y9 Acute embolism and thrombosis of unspecified deep veins of unspecified proximal lower extremity: Secondary | ICD-10-CM | POA: Diagnosis not present

## 2016-02-14 DIAGNOSIS — Z7901 Long term (current) use of anticoagulants: Secondary | ICD-10-CM | POA: Diagnosis not present

## 2016-02-18 DIAGNOSIS — M62838 Other muscle spasm: Secondary | ICD-10-CM | POA: Diagnosis not present

## 2016-02-18 DIAGNOSIS — M546 Pain in thoracic spine: Secondary | ICD-10-CM | POA: Diagnosis not present

## 2016-02-18 DIAGNOSIS — M9902 Segmental and somatic dysfunction of thoracic region: Secondary | ICD-10-CM | POA: Diagnosis not present

## 2016-02-18 DIAGNOSIS — M9903 Segmental and somatic dysfunction of lumbar region: Secondary | ICD-10-CM | POA: Diagnosis not present

## 2016-02-24 DIAGNOSIS — L728 Other follicular cysts of the skin and subcutaneous tissue: Secondary | ICD-10-CM | POA: Diagnosis not present

## 2016-02-26 DIAGNOSIS — Z7901 Long term (current) use of anticoagulants: Secondary | ICD-10-CM | POA: Diagnosis not present

## 2016-02-26 DIAGNOSIS — I824Y9 Acute embolism and thrombosis of unspecified deep veins of unspecified proximal lower extremity: Secondary | ICD-10-CM | POA: Diagnosis not present

## 2016-03-03 DIAGNOSIS — L728 Other follicular cysts of the skin and subcutaneous tissue: Secondary | ICD-10-CM | POA: Diagnosis not present

## 2016-03-06 DIAGNOSIS — L03312 Cellulitis of back [any part except buttock]: Secondary | ICD-10-CM | POA: Diagnosis not present

## 2016-03-10 DIAGNOSIS — H43811 Vitreous degeneration, right eye: Secondary | ICD-10-CM | POA: Diagnosis not present

## 2016-03-13 DIAGNOSIS — I824Y9 Acute embolism and thrombosis of unspecified deep veins of unspecified proximal lower extremity: Secondary | ICD-10-CM | POA: Diagnosis not present

## 2016-03-13 DIAGNOSIS — Z7901 Long term (current) use of anticoagulants: Secondary | ICD-10-CM | POA: Diagnosis not present

## 2016-03-20 DIAGNOSIS — I824Y9 Acute embolism and thrombosis of unspecified deep veins of unspecified proximal lower extremity: Secondary | ICD-10-CM | POA: Diagnosis not present

## 2016-03-20 DIAGNOSIS — Z7901 Long term (current) use of anticoagulants: Secondary | ICD-10-CM | POA: Diagnosis not present

## 2016-03-23 DIAGNOSIS — M9903 Segmental and somatic dysfunction of lumbar region: Secondary | ICD-10-CM | POA: Diagnosis not present

## 2016-03-23 DIAGNOSIS — M5441 Lumbago with sciatica, right side: Secondary | ICD-10-CM | POA: Diagnosis not present

## 2016-03-31 DIAGNOSIS — H43811 Vitreous degeneration, right eye: Secondary | ICD-10-CM | POA: Diagnosis not present

## 2016-03-31 DIAGNOSIS — H353132 Nonexudative age-related macular degeneration, bilateral, intermediate dry stage: Secondary | ICD-10-CM | POA: Diagnosis not present

## 2016-04-03 DIAGNOSIS — I824Y9 Acute embolism and thrombosis of unspecified deep veins of unspecified proximal lower extremity: Secondary | ICD-10-CM | POA: Diagnosis not present

## 2016-04-03 DIAGNOSIS — Z7901 Long term (current) use of anticoagulants: Secondary | ICD-10-CM | POA: Diagnosis not present

## 2016-04-15 ENCOUNTER — Emergency Department: Payer: PPO

## 2016-04-15 ENCOUNTER — Encounter: Payer: Self-pay | Admitting: *Deleted

## 2016-04-15 ENCOUNTER — Emergency Department
Admission: EM | Admit: 2016-04-15 | Discharge: 2016-04-15 | Disposition: A | Discharge status: Home / Self Care | Payer: PPO | Attending: Emergency Medicine | Admitting: Emergency Medicine

## 2016-04-15 DIAGNOSIS — Y929 Unspecified place or not applicable: Secondary | ICD-10-CM | POA: Diagnosis not present

## 2016-04-15 DIAGNOSIS — Z86718 Personal history of other venous thrombosis and embolism: Secondary | ICD-10-CM | POA: Diagnosis not present

## 2016-04-15 DIAGNOSIS — Z79899 Other long term (current) drug therapy: Secondary | ICD-10-CM | POA: Diagnosis not present

## 2016-04-15 DIAGNOSIS — I10 Essential (primary) hypertension: Secondary | ICD-10-CM | POA: Diagnosis not present

## 2016-04-15 DIAGNOSIS — Z85828 Personal history of other malignant neoplasm of skin: Secondary | ICD-10-CM | POA: Insufficient documentation

## 2016-04-15 DIAGNOSIS — S60212A Contusion of left wrist, initial encounter: Secondary | ICD-10-CM | POA: Diagnosis not present

## 2016-04-15 DIAGNOSIS — S0003XA Contusion of scalp, initial encounter: Secondary | ICD-10-CM | POA: Diagnosis not present

## 2016-04-15 DIAGNOSIS — L97509 Non-pressure chronic ulcer of other part of unspecified foot with unspecified severity: Secondary | ICD-10-CM | POA: Diagnosis not present

## 2016-04-15 DIAGNOSIS — S0083XA Contusion of other part of head, initial encounter: Secondary | ICD-10-CM | POA: Diagnosis not present

## 2016-04-15 DIAGNOSIS — M1990 Unspecified osteoarthritis, unspecified site: Secondary | ICD-10-CM | POA: Diagnosis not present

## 2016-04-15 DIAGNOSIS — Y9301 Activity, walking, marching and hiking: Secondary | ICD-10-CM | POA: Diagnosis not present

## 2016-04-15 DIAGNOSIS — S6992XA Unspecified injury of left wrist, hand and finger(s), initial encounter: Secondary | ICD-10-CM | POA: Diagnosis not present

## 2016-04-15 DIAGNOSIS — W0110XA Fall on same level from slipping, tripping and stumbling with subsequent striking against unspecified object, initial encounter: Secondary | ICD-10-CM | POA: Diagnosis not present

## 2016-04-15 DIAGNOSIS — E785 Hyperlipidemia, unspecified: Secondary | ICD-10-CM | POA: Insufficient documentation

## 2016-04-15 DIAGNOSIS — S01511A Laceration without foreign body of lip, initial encounter: Secondary | ICD-10-CM

## 2016-04-15 DIAGNOSIS — S0081XA Abrasion of other part of head, initial encounter: Secondary | ICD-10-CM | POA: Diagnosis not present

## 2016-04-15 DIAGNOSIS — Y999 Unspecified external cause status: Secondary | ICD-10-CM | POA: Insufficient documentation

## 2016-04-15 DIAGNOSIS — R0781 Pleurodynia: Secondary | ICD-10-CM | POA: Diagnosis not present

## 2016-04-15 DIAGNOSIS — S0093XA Contusion of unspecified part of head, initial encounter: Secondary | ICD-10-CM | POA: Diagnosis not present

## 2016-04-15 DIAGNOSIS — E11621 Type 2 diabetes mellitus with foot ulcer: Secondary | ICD-10-CM | POA: Insufficient documentation

## 2016-04-15 HISTORY — DX: Acute embolism and thrombosis of unspecified deep veins of unspecified lower extremity: I82.409

## 2016-04-15 LAB — PROTIME-INR
INR: 1.4
PROTHROMBIN TIME: 17.3 s — AB (ref 11.4–15.0)

## 2016-04-15 MED ORDER — BACITRACIN ZINC 500 UNIT/GM EX OINT
TOPICAL_OINTMENT | Freq: Once | CUTANEOUS | Status: AC
  Administered 2016-04-15: 1 via TOPICAL

## 2016-04-15 MED ORDER — BACITRACIN ZINC 500 UNIT/GM EX OINT
TOPICAL_OINTMENT | CUTANEOUS | Status: AC
  Administered 2016-04-15: 1 via TOPICAL
  Filled 2016-04-15: qty 0.9
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
## 2016-04-15 NOTE — Discharge Instructions (Signed)
You were evaluated after a fall, and no serious injury is suspected. You have multiple areas of bruising/contusion to the face and left wrist.  Your lip laceration should heal well on its own.    Return to the ER for any new or worsening condition including any numbness, weakness, confusion or altered mental status, worsening or severe headache, trouble breathing, chest pain or any other symptoms concerning to you.   Contusion A contusion is a deep bruise. Contusions are the result of a blunt injury to tissues and muscle fibers under the skin. The injury causes bleeding under the skin. The skin overlying the contusion may turn blue, purple, or yellow. Minor injuries will give you a painless contusion, but more severe contusions may stay painful and swollen for a few weeks.  CAUSES  This condition is usually caused by a blow, trauma, or direct force to an area of the body. SYMPTOMS  Symptoms of this condition include:  Swelling of the injured area.  Pain and tenderness in the injured area.  Discoloration. The area may have redness and then turn blue, purple, or yellow. DIAGNOSIS  This condition is diagnosed based on a physical exam and medical history. An X-ray, CT scan, or MRI may be needed to determine if there are any associated injuries, such as broken bones (fractures). TREATMENT  Specific treatment for this condition depends on what area of the body was injured. In general, the best treatment for a contusion is resting, icing, applying pressure to (compression), and elevating the injured area. This is often called the RICE strategy. Over-the-counter anti-inflammatory medicines may also be recommended for pain control.  HOME CARE INSTRUCTIONS   Rest the injured area.  If directed, apply ice to the injured area:  Put ice in a plastic bag.  Place a towel between your skin and the bag.  Leave the ice on for 20 minutes, 2-3 times per day.  If directed, apply light compression to  the injured area using an elastic bandage. Make sure the bandage is not wrapped too tightly. Remove and reapply the bandage as directed by your health care provider.  If possible, raise (elevate) the injured area above the level of your heart while you are sitting or lying down.  Take over-the-counter and prescription medicines only as told by your health care provider. SEEK MEDICAL CARE IF:  Your symptoms do not improve after several days of treatment.  Your symptoms get worse.  You have difficulty moving the injured area. SEEK IMMEDIATE MEDICAL CARE IF:   You have severe pain.  You have numbness in a hand or foot.  Your hand or foot turns pale or cold.   This information is not intended to replace advice given to you by your health care provider. Make sure you discuss any questions you have with your health care provider.   Document Released: 09/09/2005 Document Revised: 08/21/2015 Document Reviewed: 04/17/2015 Elsevier Interactive Patient Education 2016 [place] An abrasion is a cut or scrape on the outer surface of your skin. An abrasion does not extend through all of the layers of your skin. It is important to care for your abrasion properly to prevent infection. CAUSES Most abrasions are caused by falling on or gliding across the ground or another surface. When your skin rubs on something, the outer and inner layer of skin rubs off.  SYMPTOMS A cut or scrape is the main symptom of this condition. The scrape may be bleeding, or it may appear red  or pink. If there was an associated fall, there may be an underlying bruise. DIAGNOSIS An abrasion is diagnosed with a physical exam. TREATMENT Treatment for this condition depends on how large and deep the abrasion is. Usually, your abrasion will be cleaned with water and mild soap. This removes any dirt or debris that may be stuck. An antibiotic ointment may be applied to the abrasion to help prevent infection. A  bandage (dressing) may be placed on the abrasion to keep it clean. You may also need a tetanus shot. HOME CARE INSTRUCTIONS Medicines  Take or apply medicines only as directed by your health care provider.  If you were prescribed an antibiotic ointment, finish all of it even if you start to feel better. Wound Care  Clean the wound with mild soap and water 2-3 times per day or as directed by your health care provider. Pat your wound dry with a clean towel. Do not rub it.  There are many different ways to close and cover a wound. Follow instructions from your health care provider about:  Wound care.  Dressing changes and removal.  Check your wound every day for signs of infection. Watch for:  Redness, swelling, or pain.  Fluid, blood, or pus. General Instructions  Keep the dressing dry as directed by your health care provider. Do not take baths, swim, use a hot tub, or do anything that would put your wound underwater until your health care provider approves.  If there is swelling, raise (elevate) the injured area above the level of your heart while you are sitting or lying down.  Keep all follow-up visits as directed by your health care provider. This is important. SEEK MEDICAL CARE IF:  You received a tetanus shot and you have swelling, severe pain, redness, or bleeding at the injection site.  Your pain is not controlled with medicine.  You have increased redness, swelling, or pain at the site of your wound. SEEK IMMEDIATE MEDICAL CARE IF:  You have a red streak going away from your wound.  You have a fever.  You have fluid, blood, or pus coming from your wound.  You notice a bad smell coming from your wound or your dressing.   This information is not intended to replace advice given to you by your health care provider. Make sure you discuss any questions you have with your health care provider.   Document Released: 09/09/2005 Document Revised: 08/21/2015 Document  Reviewed: 11/28/2014 Elsevier Interactive Patient Education 2016 [employer]  Facial or Scalp Contusion  A facial or scalp contusion is a deep bruise on the face or head. Contusions happen when an injury causes bleeding under the skin. Signs of bruising include pain, puffiness (swelling), and discolored skin. The contusion may turn blue, purple, or yellow. HOME CARE  Only take medicines as told by your doctor.  Put ice on the injured area.  Put ice in a plastic bag.  Place a towel between your skin and the bag.  Leave the ice on for 20 minutes, 2-3 times a day. GET HELP IF:  You have bite problems.  You have pain when chewing.  You are worried about your face not healing normally. GET HELP RIGHT AWAY IF:   You have severe pain or a headache and medicine does not help.  You are very tired or confused, or your personality changes.  You throw up (vomit).  You have a nosebleed that will not stop.  You see two of everything (double vision)  or have blurry vision.  You have fluid coming from your nose or ear.  You have problems walking or using your arms or legs. MAKE SURE YOU:   Understand these instructions.  Will watch your condition.  Will get help right away if you are not doing well or get worse.   This information is not intended to replace advice given to you by your health care provider. Make sure you discuss any questions you have with your health care provider.   Document Released: 11/19/2011 Document Revised: 12/21/2014 Document Reviewed: 07/13/2013 Elsevier Interactive Patient Education [employer].

## 2016-04-15 NOTE — ED Notes (Signed)
Pt arrives with a fall, pt was out walking and tripped and fell, arrives with brusing to her left eye area and forehead, abrasions to nose and lip and left facial area, pt on coumadin for hx of DVT, pt states headache at present, pt awake and alert upon arrival

## 2016-04-29 DIAGNOSIS — Z7901 Long term (current) use of anticoagulants: Secondary | ICD-10-CM | POA: Diagnosis not present

## 2016-04-29 DIAGNOSIS — I824Y9 Acute embolism and thrombosis of unspecified deep veins of unspecified proximal lower extremity: Secondary | ICD-10-CM | POA: Diagnosis not present

## 2016-05-12 DIAGNOSIS — S39012A Strain of muscle, fascia and tendon of lower back, initial encounter: Secondary | ICD-10-CM | POA: Diagnosis not present

## 2016-05-12 DIAGNOSIS — M9903 Segmental and somatic dysfunction of lumbar region: Secondary | ICD-10-CM | POA: Diagnosis not present

## 2016-05-12 DIAGNOSIS — M9901 Segmental and somatic dysfunction of cervical region: Secondary | ICD-10-CM | POA: Diagnosis not present

## 2016-05-12 DIAGNOSIS — M531 Cervicobrachial syndrome: Secondary | ICD-10-CM | POA: Diagnosis not present

## 2016-05-15 DIAGNOSIS — Z08 Encounter for follow-up examination after completed treatment for malignant neoplasm: Secondary | ICD-10-CM | POA: Diagnosis not present

## 2016-05-15 DIAGNOSIS — Z85828 Personal history of other malignant neoplasm of skin: Secondary | ICD-10-CM | POA: Diagnosis not present

## 2016-05-15 DIAGNOSIS — L728 Other follicular cysts of the skin and subcutaneous tissue: Secondary | ICD-10-CM | POA: Diagnosis not present

## 2016-05-15 DIAGNOSIS — Z1283 Encounter for screening for malignant neoplasm of skin: Secondary | ICD-10-CM | POA: Diagnosis not present

## 2016-05-20 ENCOUNTER — Other Ambulatory Visit: Payer: Self-pay | Admitting: Family Medicine

## 2016-05-20 DIAGNOSIS — Z1231 Encounter for screening mammogram for malignant neoplasm of breast: Secondary | ICD-10-CM

## 2016-05-27 ENCOUNTER — Ambulatory Visit
Admission: RE | Admit: 2016-05-27 | Discharge: 2016-05-27 | Disposition: A | Discharge status: Home / Self Care | Payer: PPO | Source: Ambulatory Visit | Attending: Family Medicine | Admitting: Family Medicine

## 2016-05-27 DIAGNOSIS — Z7901 Long term (current) use of anticoagulants: Secondary | ICD-10-CM | POA: Diagnosis not present

## 2016-05-27 DIAGNOSIS — I824Y9 Acute embolism and thrombosis of unspecified deep veins of unspecified proximal lower extremity: Secondary | ICD-10-CM | POA: Diagnosis not present

## 2016-05-27 DIAGNOSIS — Z1231 Encounter for screening mammogram for malignant neoplasm of breast: Secondary | ICD-10-CM

## 2016-06-23 DIAGNOSIS — M5137 Other intervertebral disc degeneration, lumbosacral region: Secondary | ICD-10-CM | POA: Diagnosis not present

## 2016-06-23 DIAGNOSIS — M531 Cervicobrachial syndrome: Secondary | ICD-10-CM | POA: Diagnosis not present

## 2016-06-23 DIAGNOSIS — M9903 Segmental and somatic dysfunction of lumbar region: Secondary | ICD-10-CM | POA: Diagnosis not present

## 2016-06-23 DIAGNOSIS — M9901 Segmental and somatic dysfunction of cervical region: Secondary | ICD-10-CM | POA: Diagnosis not present

## 2016-06-24 DIAGNOSIS — Z7901 Long term (current) use of anticoagulants: Secondary | ICD-10-CM | POA: Diagnosis not present

## 2016-06-24 DIAGNOSIS — I824Y9 Acute embolism and thrombosis of unspecified deep veins of unspecified proximal lower extremity: Secondary | ICD-10-CM | POA: Diagnosis not present

## 2016-07-22 DIAGNOSIS — Z7901 Long term (current) use of anticoagulants: Secondary | ICD-10-CM | POA: Diagnosis not present

## 2016-07-22 DIAGNOSIS — I824Y9 Acute embolism and thrombosis of unspecified deep veins of unspecified proximal lower extremity: Secondary | ICD-10-CM | POA: Diagnosis not present

## 2016-07-29 DIAGNOSIS — M9903 Segmental and somatic dysfunction of lumbar region: Secondary | ICD-10-CM | POA: Diagnosis not present

## 2016-07-29 DIAGNOSIS — M5441 Lumbago with sciatica, right side: Secondary | ICD-10-CM | POA: Diagnosis not present

## 2016-08-04 ENCOUNTER — Other Ambulatory Visit: Payer: Self-pay | Admitting: Family Medicine

## 2016-08-04 ENCOUNTER — Ambulatory Visit
Admission: RE | Admit: 2016-08-04 | Discharge: 2016-08-04 | Disposition: A | Discharge status: Home / Self Care | Payer: PPO | Source: Ambulatory Visit | Attending: Family Medicine | Admitting: Family Medicine

## 2016-08-04 DIAGNOSIS — Z1231 Encounter for screening mammogram for malignant neoplasm of breast: Secondary | ICD-10-CM | POA: Insufficient documentation

## 2016-08-19 DIAGNOSIS — Z7901 Long term (current) use of anticoagulants: Secondary | ICD-10-CM | POA: Diagnosis not present

## 2016-09-08 DIAGNOSIS — H353132 Nonexudative age-related macular degeneration, bilateral, intermediate dry stage: Secondary | ICD-10-CM | POA: Diagnosis not present

## 2016-09-16 DIAGNOSIS — Z7901 Long term (current) use of anticoagulants: Secondary | ICD-10-CM | POA: Diagnosis not present

## 2016-09-22 DIAGNOSIS — S5012XA Contusion of left forearm, initial encounter: Secondary | ICD-10-CM | POA: Diagnosis not present

## 2016-09-22 DIAGNOSIS — L02434 Carbuncle of left upper limb: Secondary | ICD-10-CM | POA: Diagnosis not present

## 2016-09-29 DIAGNOSIS — L0293 Carbuncle, unspecified: Secondary | ICD-10-CM | POA: Diagnosis not present

## 2016-09-29 DIAGNOSIS — S5012XA Contusion of left forearm, initial encounter: Secondary | ICD-10-CM | POA: Diagnosis not present

## 2016-09-30 DIAGNOSIS — Z7901 Long term (current) use of anticoagulants: Secondary | ICD-10-CM | POA: Diagnosis not present

## 2016-10-08 DIAGNOSIS — Z79899 Other long term (current) drug therapy: Secondary | ICD-10-CM | POA: Diagnosis not present

## 2016-10-08 DIAGNOSIS — E538 Deficiency of other specified B group vitamins: Secondary | ICD-10-CM | POA: Diagnosis not present

## 2016-10-08 DIAGNOSIS — I10 Essential (primary) hypertension: Secondary | ICD-10-CM | POA: Diagnosis not present

## 2016-10-08 DIAGNOSIS — M816 Localized osteoporosis [Lequesne]: Secondary | ICD-10-CM | POA: Diagnosis not present

## 2016-10-08 DIAGNOSIS — E559 Vitamin D deficiency, unspecified: Secondary | ICD-10-CM | POA: Diagnosis not present

## 2016-10-08 DIAGNOSIS — R739 Hyperglycemia, unspecified: Secondary | ICD-10-CM | POA: Diagnosis not present

## 2016-10-08 DIAGNOSIS — E7800 Pure hypercholesterolemia, unspecified: Secondary | ICD-10-CM | POA: Diagnosis not present

## 2016-10-08 DIAGNOSIS — Z7901 Long term (current) use of anticoagulants: Secondary | ICD-10-CM | POA: Diagnosis not present

## 2016-10-08 DIAGNOSIS — M810 Age-related osteoporosis without current pathological fracture: Secondary | ICD-10-CM | POA: Diagnosis not present

## 2016-10-08 DIAGNOSIS — E1165 Type 2 diabetes mellitus with hyperglycemia: Secondary | ICD-10-CM | POA: Diagnosis not present

## 2016-10-23 DIAGNOSIS — E538 Deficiency of other specified B group vitamins: Secondary | ICD-10-CM | POA: Diagnosis not present

## 2016-10-23 DIAGNOSIS — Z7901 Long term (current) use of anticoagulants: Secondary | ICD-10-CM | POA: Diagnosis not present

## 2016-10-30 DIAGNOSIS — Z7901 Long term (current) use of anticoagulants: Secondary | ICD-10-CM | POA: Diagnosis not present

## 2016-11-04 DIAGNOSIS — Z7901 Long term (current) use of anticoagulants: Secondary | ICD-10-CM | POA: Diagnosis not present

## 2016-11-11 DIAGNOSIS — Z7901 Long term (current) use of anticoagulants: Secondary | ICD-10-CM | POA: Diagnosis not present

## 2016-11-25 DIAGNOSIS — Z7901 Long term (current) use of anticoagulants: Secondary | ICD-10-CM | POA: Diagnosis not present

## 2016-12-04 DIAGNOSIS — Z7901 Long term (current) use of anticoagulants: Secondary | ICD-10-CM | POA: Diagnosis not present

## 2016-12-04 DIAGNOSIS — E538 Deficiency of other specified B group vitamins: Secondary | ICD-10-CM | POA: Diagnosis not present

## 2016-12-18 DIAGNOSIS — Z7901 Long term (current) use of anticoagulants: Secondary | ICD-10-CM | POA: Diagnosis not present

## 2017-01-01 DIAGNOSIS — Z7901 Long term (current) use of anticoagulants: Secondary | ICD-10-CM | POA: Diagnosis not present

## 2017-01-08 DIAGNOSIS — Z7901 Long term (current) use of anticoagulants: Secondary | ICD-10-CM | POA: Diagnosis not present

## 2017-01-08 DIAGNOSIS — E538 Deficiency of other specified B group vitamins: Secondary | ICD-10-CM | POA: Diagnosis not present

## 2017-02-05 DIAGNOSIS — Z7901 Long term (current) use of anticoagulants: Secondary | ICD-10-CM | POA: Diagnosis not present

## 2017-03-05 DIAGNOSIS — Z7901 Long term (current) use of anticoagulants: Secondary | ICD-10-CM | POA: Diagnosis not present

## 2017-03-08 DIAGNOSIS — H353132 Nonexudative age-related macular degeneration, bilateral, intermediate dry stage: Secondary | ICD-10-CM | POA: Diagnosis not present

## 2017-03-08 IMAGING — MG MM DIGITAL SCREENING BILAT W/ CAD
6 series · 6 of 6 positions shown · non-contrast
Comparison: Previous exam(s).

ACR Breast Density Category a: The breast tissue is almost entirely
fatty.

CLINICAL DATA: Screening.

EXAM:
DIGITAL SCREENING BILATERAL MAMMOGRAM WITH CAD

[L CC]
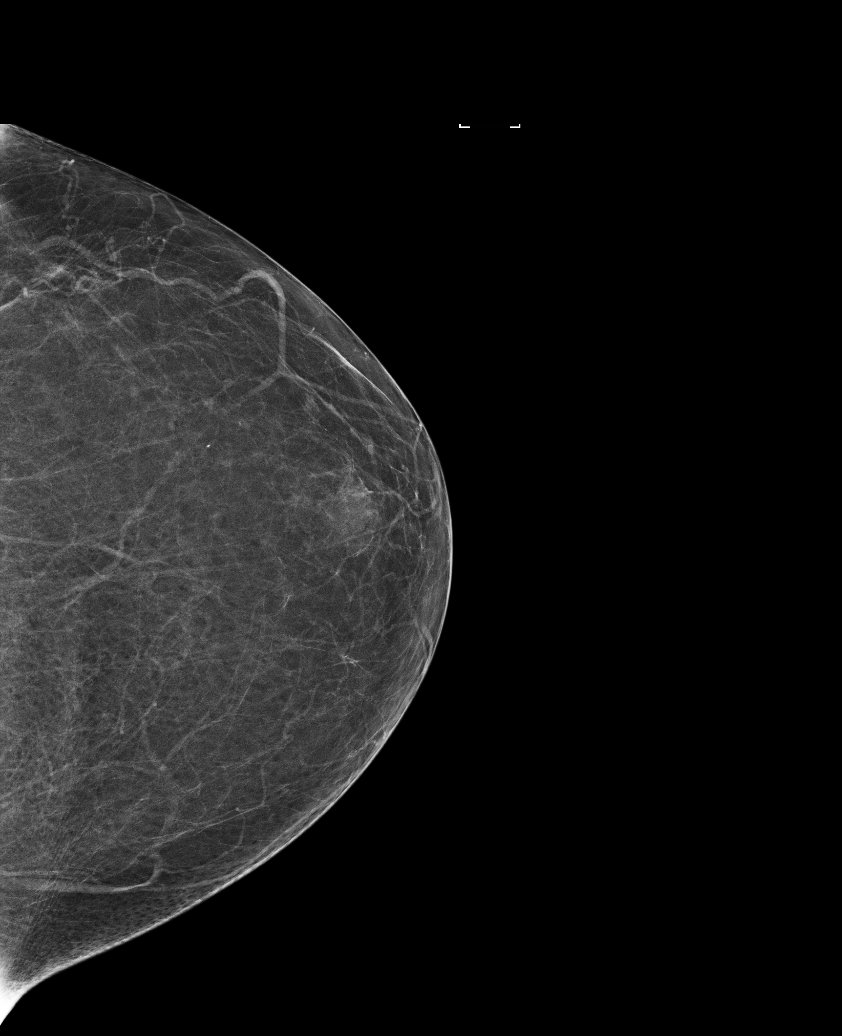

[L MLO (1 of 2)]
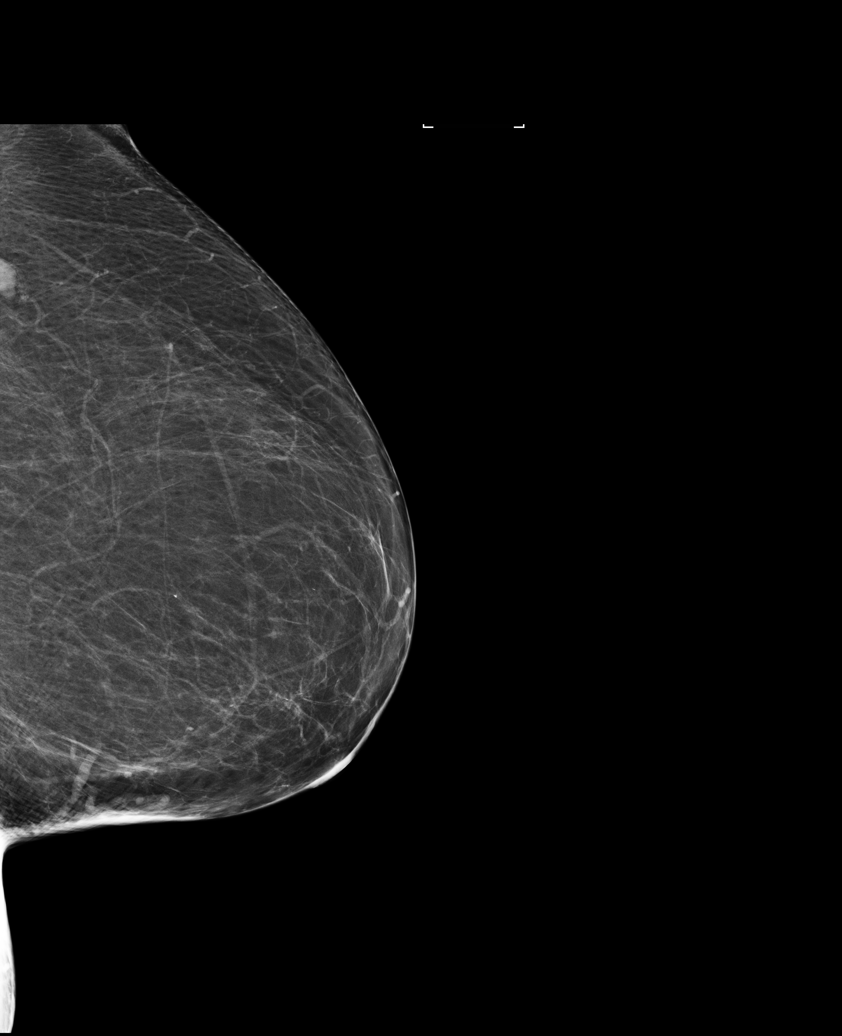

[R MLO (1 of 2)]
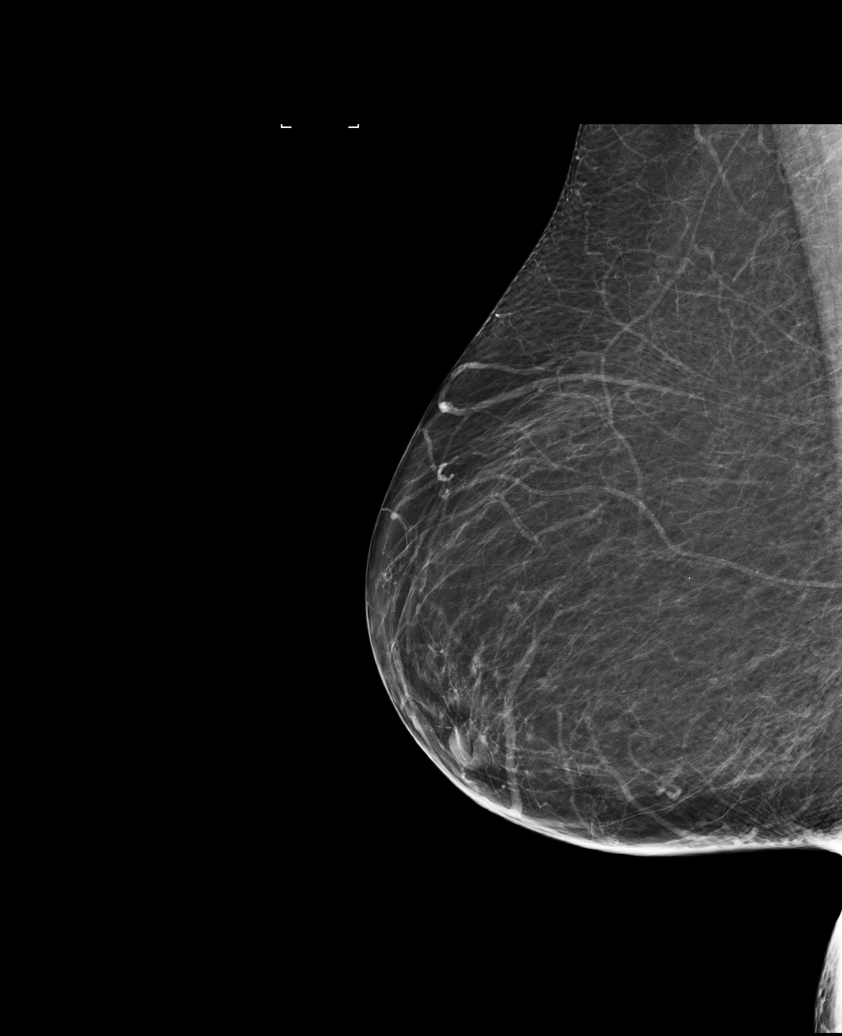

[R MLO (2 of 2)]
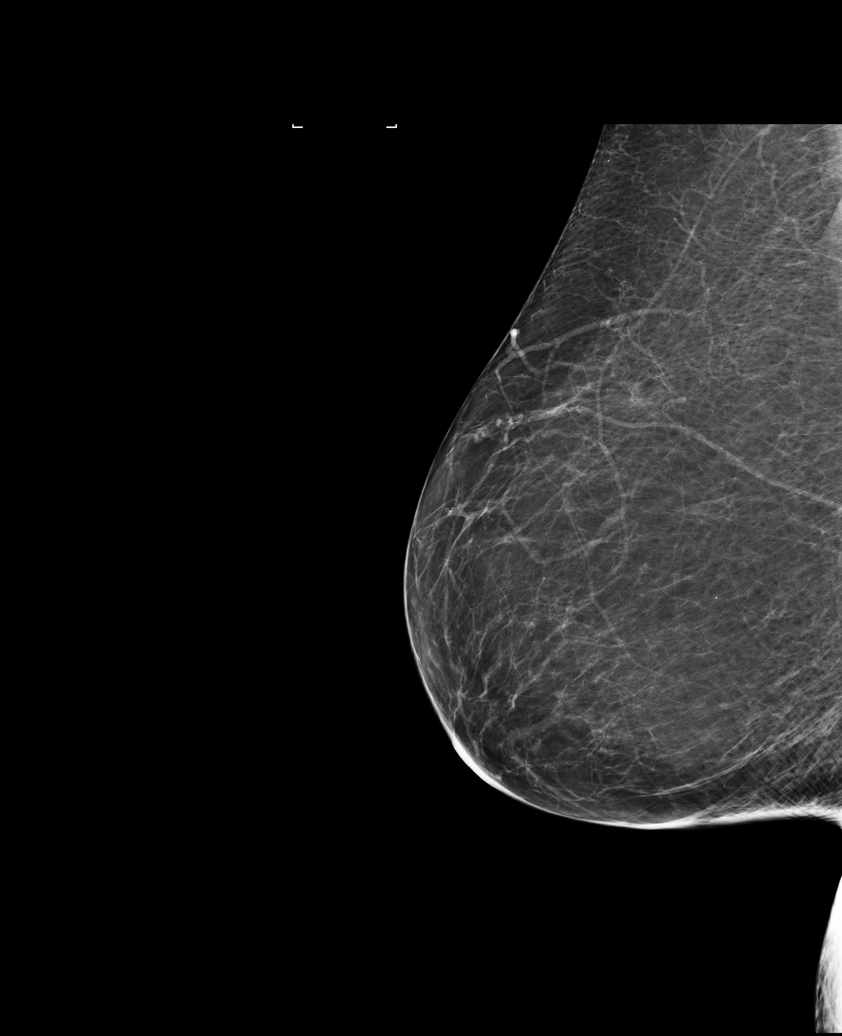

[R CC]
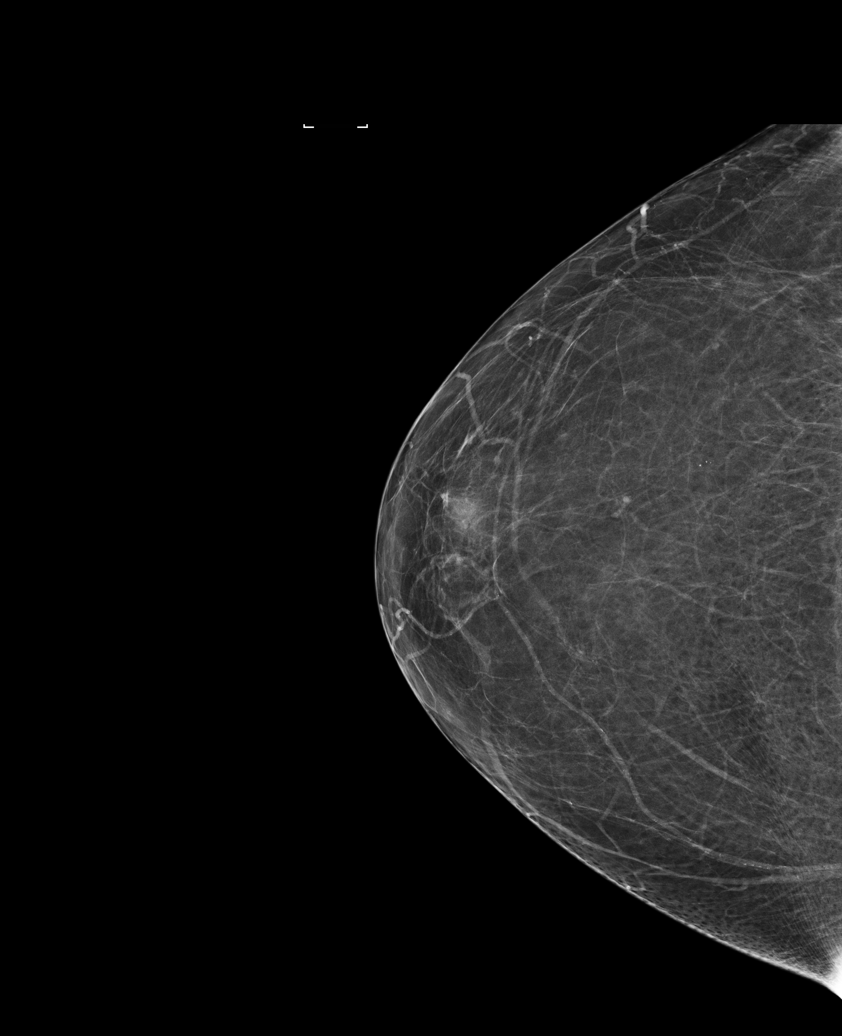

[L MLO (2 of 2)]
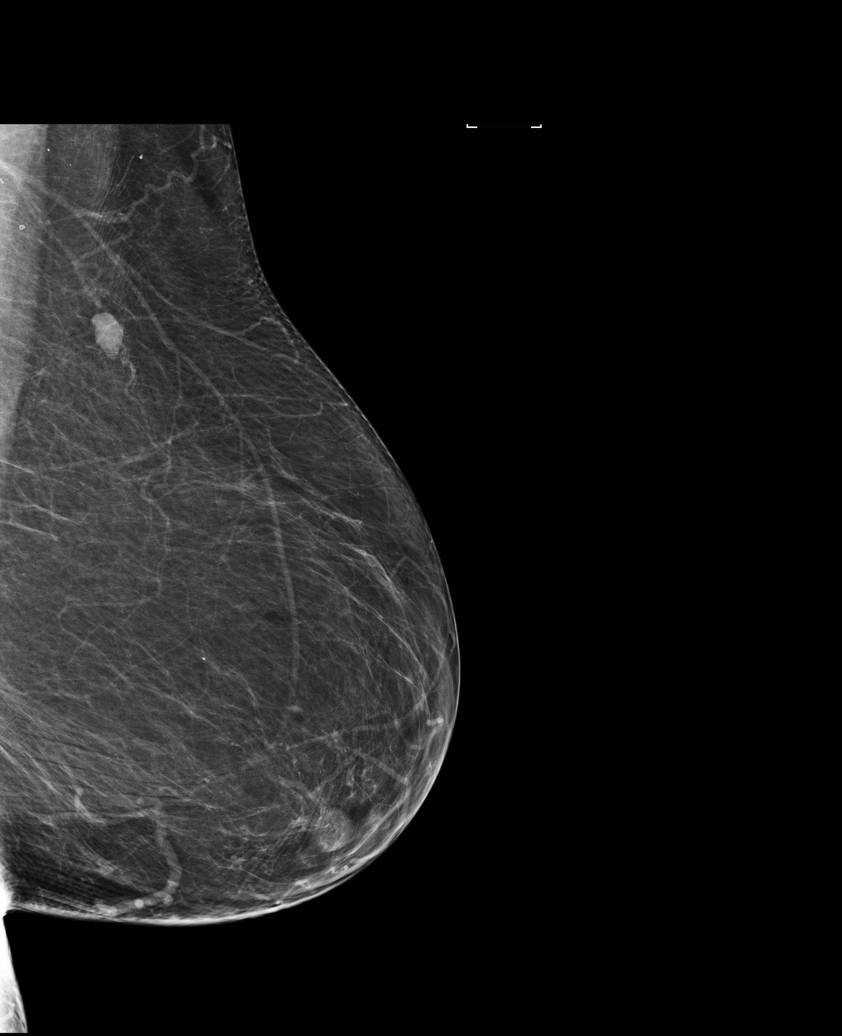

[6 of 6 positions shown; findings below may reference images not displayed]

FINDINGS: There are no findings suspicious for malignancy. Images were
processed with CAD.
IMPRESSION: No mammographic evidence of malignancy. A result letter of this
screening mammogram will be mailed directly to the patient.

RECOMMENDATION:
Screening mammogram in one year. (Code:[id number])

BI-RADS CATEGORY  1: Negative.

## 2017-03-22 DIAGNOSIS — Z7901 Long term (current) use of anticoagulants: Secondary | ICD-10-CM | POA: Diagnosis not present

## 2017-04-01 DIAGNOSIS — R739 Hyperglycemia, unspecified: Secondary | ICD-10-CM | POA: Diagnosis not present

## 2017-04-01 DIAGNOSIS — E538 Deficiency of other specified B group vitamins: Secondary | ICD-10-CM | POA: Diagnosis not present

## 2017-04-01 DIAGNOSIS — E559 Vitamin D deficiency, unspecified: Secondary | ICD-10-CM | POA: Diagnosis not present

## 2017-04-01 DIAGNOSIS — I10 Essential (primary) hypertension: Secondary | ICD-10-CM | POA: Diagnosis not present

## 2017-04-08 DIAGNOSIS — M810 Age-related osteoporosis without current pathological fracture: Secondary | ICD-10-CM | POA: Diagnosis not present

## 2017-04-08 DIAGNOSIS — Z5181 Encounter for therapeutic drug level monitoring: Secondary | ICD-10-CM | POA: Diagnosis not present

## 2017-04-08 DIAGNOSIS — E559 Vitamin D deficiency, unspecified: Secondary | ICD-10-CM | POA: Diagnosis not present

## 2017-04-08 DIAGNOSIS — I10 Essential (primary) hypertension: Secondary | ICD-10-CM | POA: Diagnosis not present

## 2017-04-08 DIAGNOSIS — E7800 Pure hypercholesterolemia, unspecified: Secondary | ICD-10-CM | POA: Diagnosis not present

## 2017-04-08 DIAGNOSIS — R7989 Other specified abnormal findings of blood chemistry: Secondary | ICD-10-CM | POA: Diagnosis not present

## 2017-04-08 DIAGNOSIS — E538 Deficiency of other specified B group vitamins: Secondary | ICD-10-CM | POA: Diagnosis not present

## 2017-04-08 DIAGNOSIS — D708 Other neutropenia: Secondary | ICD-10-CM | POA: Diagnosis not present

## 2017-04-08 DIAGNOSIS — Z7901 Long term (current) use of anticoagulants: Secondary | ICD-10-CM | POA: Diagnosis not present

## 2017-04-08 DIAGNOSIS — R938 Abnormal findings on diagnostic imaging of other specified body structures: Secondary | ICD-10-CM | POA: Diagnosis not present

## 2017-04-08 DIAGNOSIS — R739 Hyperglycemia, unspecified: Secondary | ICD-10-CM | POA: Diagnosis not present

## 2017-04-09 ENCOUNTER — Other Ambulatory Visit: Payer: Self-pay | Admitting: Family Medicine

## 2017-04-09 DIAGNOSIS — R9389 Abnormal findings on diagnostic imaging of other specified body structures: Secondary | ICD-10-CM

## 2017-04-13 ENCOUNTER — Other Ambulatory Visit: Payer: Self-pay | Admitting: Internal Medicine

## 2017-04-13 DIAGNOSIS — Z8781 Personal history of (healed) traumatic fracture: Secondary | ICD-10-CM | POA: Diagnosis not present

## 2017-04-13 DIAGNOSIS — E559 Vitamin D deficiency, unspecified: Secondary | ICD-10-CM | POA: Diagnosis not present

## 2017-04-13 DIAGNOSIS — M810 Age-related osteoporosis without current pathological fracture: Secondary | ICD-10-CM | POA: Diagnosis not present

## 2017-04-19 DIAGNOSIS — Z7901 Long term (current) use of anticoagulants: Secondary | ICD-10-CM | POA: Diagnosis not present

## 2017-04-20 ENCOUNTER — Ambulatory Visit: Payer: PPO

## 2017-04-22 ENCOUNTER — Ambulatory Visit
Admission: RE | Admit: 2017-04-22 | Discharge: 2017-04-22 | Disposition: A | Discharge status: Home / Self Care | Payer: PPO | Source: Ambulatory Visit | Attending: Internal Medicine | Admitting: Internal Medicine

## 2017-04-22 ENCOUNTER — Ambulatory Visit: Payer: PPO

## 2017-04-22 DIAGNOSIS — Z96641 Presence of right artificial hip joint: Secondary | ICD-10-CM | POA: Diagnosis not present

## 2017-04-22 DIAGNOSIS — Z780 Asymptomatic menopausal state: Secondary | ICD-10-CM | POA: Diagnosis not present

## 2017-04-22 DIAGNOSIS — M810 Age-related osteoporosis without current pathological fracture: Secondary | ICD-10-CM | POA: Diagnosis not present

## 2017-04-26 ENCOUNTER — Ambulatory Visit: Payer: PPO | Admitting: Oncology

## 2017-04-27 ENCOUNTER — Ambulatory Visit: Payer: PPO | Admitting: Oncology

## 2017-05-17 DIAGNOSIS — Z7901 Long term (current) use of anticoagulants: Secondary | ICD-10-CM | POA: Diagnosis not present

## 2017-05-17 DIAGNOSIS — Z85828 Personal history of other malignant neoplasm of skin: Secondary | ICD-10-CM | POA: Diagnosis not present

## 2017-05-17 DIAGNOSIS — Z859 Personal history of malignant neoplasm, unspecified: Secondary | ICD-10-CM | POA: Diagnosis not present

## 2017-05-17 DIAGNOSIS — L821 Other seborrheic keratosis: Secondary | ICD-10-CM | POA: Diagnosis not present

## 2017-05-17 DIAGNOSIS — Z872 Personal history of diseases of the skin and subcutaneous tissue: Secondary | ICD-10-CM | POA: Diagnosis not present

## 2017-05-24 DIAGNOSIS — M810 Age-related osteoporosis without current pathological fracture: Secondary | ICD-10-CM | POA: Diagnosis not present

## 2017-06-09 DIAGNOSIS — M810 Age-related osteoporosis without current pathological fracture: Secondary | ICD-10-CM | POA: Diagnosis not present

## 2017-06-09 DIAGNOSIS — M25551 Pain in right hip: Secondary | ICD-10-CM | POA: Diagnosis not present

## 2017-06-14 DIAGNOSIS — Z7901 Long term (current) use of anticoagulants: Secondary | ICD-10-CM | POA: Diagnosis not present

## 2017-07-05 DIAGNOSIS — H353132 Nonexudative age-related macular degeneration, bilateral, intermediate dry stage: Secondary | ICD-10-CM | POA: Diagnosis not present

## 2017-07-07 DIAGNOSIS — Z7901 Long term (current) use of anticoagulants: Secondary | ICD-10-CM | POA: Diagnosis not present

## 2017-07-12 ENCOUNTER — Other Ambulatory Visit: Payer: Self-pay | Admitting: Family Medicine

## 2017-07-12 DIAGNOSIS — Z1231 Encounter for screening mammogram for malignant neoplasm of breast: Secondary | ICD-10-CM

## 2017-08-04 DIAGNOSIS — Z7901 Long term (current) use of anticoagulants: Secondary | ICD-10-CM | POA: Diagnosis not present

## 2017-08-06 ENCOUNTER — Ambulatory Visit
Admission: RE | Admit: 2017-08-06 | Discharge: 2017-08-06 | Disposition: A | Discharge status: Home / Self Care | Payer: PPO | Source: Ambulatory Visit | Attending: Family Medicine | Admitting: Family Medicine

## 2017-08-06 DIAGNOSIS — Z1231 Encounter for screening mammogram for malignant neoplasm of breast: Secondary | ICD-10-CM | POA: Insufficient documentation

## 2017-09-01 DIAGNOSIS — Z7901 Long term (current) use of anticoagulants: Secondary | ICD-10-CM | POA: Diagnosis not present

## 2017-10-11 DIAGNOSIS — I10 Essential (primary) hypertension: Secondary | ICD-10-CM | POA: Diagnosis not present

## 2017-10-11 DIAGNOSIS — R7989 Other specified abnormal findings of blood chemistry: Secondary | ICD-10-CM | POA: Diagnosis not present

## 2017-10-11 DIAGNOSIS — R739 Hyperglycemia, unspecified: Secondary | ICD-10-CM | POA: Diagnosis not present

## 2017-10-11 DIAGNOSIS — E538 Deficiency of other specified B group vitamins: Secondary | ICD-10-CM | POA: Diagnosis not present

## 2017-10-11 DIAGNOSIS — Z7901 Long term (current) use of anticoagulants: Secondary | ICD-10-CM | POA: Diagnosis not present

## 2017-11-10 DIAGNOSIS — Z7901 Long term (current) use of anticoagulants: Secondary | ICD-10-CM | POA: Diagnosis not present

## 2017-11-17 DIAGNOSIS — Z7901 Long term (current) use of anticoagulants: Secondary | ICD-10-CM | POA: Diagnosis not present

## 2017-11-26 DIAGNOSIS — Z7901 Long term (current) use of anticoagulants: Secondary | ICD-10-CM | POA: Diagnosis not present

## 2017-12-09 DIAGNOSIS — Z7901 Long term (current) use of anticoagulants: Secondary | ICD-10-CM | POA: Diagnosis not present

## 2018-01-05 DIAGNOSIS — Z7901 Long term (current) use of anticoagulants: Secondary | ICD-10-CM | POA: Diagnosis not present

## 2018-01-07 DIAGNOSIS — H353132 Nonexudative age-related macular degeneration, bilateral, intermediate dry stage: Secondary | ICD-10-CM | POA: Diagnosis not present

## 2018-01-12 DIAGNOSIS — Z7901 Long term (current) use of anticoagulants: Secondary | ICD-10-CM | POA: Diagnosis not present

## 2018-01-26 DIAGNOSIS — Z7901 Long term (current) use of anticoagulants: Secondary | ICD-10-CM | POA: Diagnosis not present

## 2018-02-23 DIAGNOSIS — Z7901 Long term (current) use of anticoagulants: Secondary | ICD-10-CM | POA: Diagnosis not present

## 2018-03-03 DIAGNOSIS — M65332 Trigger finger, left middle finger: Secondary | ICD-10-CM | POA: Diagnosis not present

## 2018-03-03 DIAGNOSIS — E669 Obesity, unspecified: Secondary | ICD-10-CM | POA: Diagnosis not present

## 2018-03-23 DIAGNOSIS — Z7901 Long term (current) use of anticoagulants: Secondary | ICD-10-CM | POA: Diagnosis not present

## 2018-04-20 DIAGNOSIS — E538 Deficiency of other specified B group vitamins: Secondary | ICD-10-CM | POA: Diagnosis not present

## 2018-04-20 DIAGNOSIS — I10 Essential (primary) hypertension: Secondary | ICD-10-CM | POA: Diagnosis not present

## 2018-04-20 DIAGNOSIS — Z7901 Long term (current) use of anticoagulants: Secondary | ICD-10-CM | POA: Diagnosis not present

## 2018-04-20 DIAGNOSIS — L03116 Cellulitis of left lower limb: Secondary | ICD-10-CM | POA: Diagnosis not present

## 2018-04-20 DIAGNOSIS — E7800 Pure hypercholesterolemia, unspecified: Secondary | ICD-10-CM | POA: Diagnosis not present

## 2018-04-20 DIAGNOSIS — Z23 Encounter for immunization: Secondary | ICD-10-CM | POA: Diagnosis not present

## 2018-04-20 DIAGNOSIS — Z1231 Encounter for screening mammogram for malignant neoplasm of breast: Secondary | ICD-10-CM | POA: Diagnosis not present

## 2018-04-20 DIAGNOSIS — R739 Hyperglycemia, unspecified: Secondary | ICD-10-CM | POA: Diagnosis not present

## 2018-04-25 DIAGNOSIS — Z7901 Long term (current) use of anticoagulants: Secondary | ICD-10-CM | POA: Diagnosis not present

## 2018-05-18 DIAGNOSIS — Z872 Personal history of diseases of the skin and subcutaneous tissue: Secondary | ICD-10-CM | POA: Diagnosis not present

## 2018-05-18 DIAGNOSIS — L821 Other seborrheic keratosis: Secondary | ICD-10-CM | POA: Diagnosis not present

## 2018-05-18 DIAGNOSIS — L570 Actinic keratosis: Secondary | ICD-10-CM | POA: Diagnosis not present

## 2018-05-18 DIAGNOSIS — Z859 Personal history of malignant neoplasm, unspecified: Secondary | ICD-10-CM | POA: Diagnosis not present

## 2018-05-18 DIAGNOSIS — L578 Other skin changes due to chronic exposure to nonionizing radiation: Secondary | ICD-10-CM | POA: Diagnosis not present

## 2018-05-18 DIAGNOSIS — Z85828 Personal history of other malignant neoplasm of skin: Secondary | ICD-10-CM | POA: Diagnosis not present

## 2018-05-25 DIAGNOSIS — Z7901 Long term (current) use of anticoagulants: Secondary | ICD-10-CM | POA: Diagnosis not present

## 2018-06-22 DIAGNOSIS — Z7901 Long term (current) use of anticoagulants: Secondary | ICD-10-CM | POA: Diagnosis not present

## 2018-07-06 ENCOUNTER — Other Ambulatory Visit: Payer: Self-pay | Admitting: Family Medicine

## 2018-07-06 DIAGNOSIS — Z1231 Encounter for screening mammogram for malignant neoplasm of breast: Secondary | ICD-10-CM

## 2018-08-03 DIAGNOSIS — Z7901 Long term (current) use of anticoagulants: Secondary | ICD-10-CM | POA: Diagnosis not present

## 2018-08-16 ENCOUNTER — Ambulatory Visit
Admission: RE | Admit: 2018-08-16 | Discharge: 2018-08-16 | Disposition: A | Discharge status: Home / Self Care | Payer: PPO | Source: Ambulatory Visit | Attending: Family Medicine | Admitting: Family Medicine

## 2018-08-16 DIAGNOSIS — Z1231 Encounter for screening mammogram for malignant neoplasm of breast: Secondary | ICD-10-CM | POA: Diagnosis not present

## 2018-08-25 DIAGNOSIS — H353132 Nonexudative age-related macular degeneration, bilateral, intermediate dry stage: Secondary | ICD-10-CM | POA: Diagnosis not present

## 2018-09-14 DIAGNOSIS — Z7901 Long term (current) use of anticoagulants: Secondary | ICD-10-CM | POA: Diagnosis not present

## 2018-10-14 DIAGNOSIS — H353132 Nonexudative age-related macular degeneration, bilateral, intermediate dry stage: Secondary | ICD-10-CM | POA: Diagnosis not present

## 2018-10-24 DIAGNOSIS — R739 Hyperglycemia, unspecified: Secondary | ICD-10-CM | POA: Diagnosis not present

## 2018-10-24 DIAGNOSIS — Z7901 Long term (current) use of anticoagulants: Secondary | ICD-10-CM | POA: Diagnosis not present

## 2018-10-24 DIAGNOSIS — I10 Essential (primary) hypertension: Secondary | ICD-10-CM | POA: Diagnosis not present

## 2018-11-30 DIAGNOSIS — Z7901 Long term (current) use of anticoagulants: Secondary | ICD-10-CM | POA: Diagnosis not present

## 2018-12-08 DIAGNOSIS — R030 Elevated blood-pressure reading, without diagnosis of hypertension: Secondary | ICD-10-CM | POA: Diagnosis not present

## 2018-12-08 DIAGNOSIS — R21 Rash and other nonspecific skin eruption: Secondary | ICD-10-CM | POA: Diagnosis not present

## 2019-07-10 ENCOUNTER — Other Ambulatory Visit: Payer: Self-pay | Admitting: Family Medicine

## 2019-07-10 DIAGNOSIS — Z1231 Encounter for screening mammogram for malignant neoplasm of breast: Secondary | ICD-10-CM

## 2019-09-25 ENCOUNTER — Ambulatory Visit
Admission: RE | Admit: 2019-09-25 | Discharge: 2019-09-25 | Disposition: A | Discharge status: Home / Self Care | Payer: Medicare Other | Source: Ambulatory Visit | Attending: Family Medicine | Admitting: Family Medicine

## 2019-09-25 DIAGNOSIS — Z1231 Encounter for screening mammogram for malignant neoplasm of breast: Secondary | ICD-10-CM | POA: Insufficient documentation

## 2020-08-20 ENCOUNTER — Other Ambulatory Visit: Payer: Self-pay | Admitting: Family Medicine

## 2020-08-20 DIAGNOSIS — Z1231 Encounter for screening mammogram for malignant neoplasm of breast: Secondary | ICD-10-CM

## 2020-09-26 ENCOUNTER — Ambulatory Visit
Admission: RE | Admit: 2020-09-26 | Discharge: 2020-09-26 | Disposition: A | Discharge status: Home / Self Care | Payer: Medicare Other | Source: Ambulatory Visit | Attending: Family Medicine | Admitting: Family Medicine

## 2020-09-26 ENCOUNTER — Other Ambulatory Visit: Payer: Self-pay

## 2020-09-26 DIAGNOSIS — Z1231 Encounter for screening mammogram for malignant neoplasm of breast: Secondary | ICD-10-CM | POA: Insufficient documentation

## 2021-04-03 ENCOUNTER — Emergency Department: Payer: Medicare Other

## 2021-04-03 ENCOUNTER — Other Ambulatory Visit: Payer: Self-pay

## 2021-04-03 ENCOUNTER — Emergency Department
Admission: EM | Admit: 2021-04-03 | Discharge: 2021-04-04 | Disposition: A | Discharge status: Home / Self Care | Payer: Medicare Other | Source: Home / Self Care | Attending: Emergency Medicine | Admitting: Emergency Medicine

## 2021-04-03 DIAGNOSIS — R079 Chest pain, unspecified: Secondary | ICD-10-CM | POA: Insufficient documentation

## 2021-04-03 DIAGNOSIS — I10 Essential (primary) hypertension: Secondary | ICD-10-CM | POA: Insufficient documentation

## 2021-04-03 DIAGNOSIS — Z96649 Presence of unspecified artificial hip joint: Secondary | ICD-10-CM | POA: Insufficient documentation

## 2021-04-03 DIAGNOSIS — Z7901 Long term (current) use of anticoagulants: Secondary | ICD-10-CM | POA: Insufficient documentation

## 2021-04-03 DIAGNOSIS — Z85828 Personal history of other malignant neoplasm of skin: Secondary | ICD-10-CM | POA: Insufficient documentation

## 2021-04-03 DIAGNOSIS — S0083XA Contusion of other part of head, initial encounter: Secondary | ICD-10-CM | POA: Insufficient documentation

## 2021-04-03 DIAGNOSIS — E119 Type 2 diabetes mellitus without complications: Secondary | ICD-10-CM | POA: Insufficient documentation

## 2021-04-03 DIAGNOSIS — J9601 Acute respiratory failure with hypoxia: Secondary | ICD-10-CM | POA: Diagnosis not present

## 2021-04-03 DIAGNOSIS — Z79899 Other long term (current) drug therapy: Secondary | ICD-10-CM | POA: Insufficient documentation

## 2021-04-03 DIAGNOSIS — F0390 Unspecified dementia without behavioral disturbance: Secondary | ICD-10-CM | POA: Insufficient documentation

## 2021-04-03 DIAGNOSIS — W19XXXA Unspecified fall, initial encounter: Secondary | ICD-10-CM

## 2021-04-03 DIAGNOSIS — A411 Sepsis due to other specified staphylococcus: Secondary | ICD-10-CM | POA: Diagnosis not present

## 2021-04-03 DIAGNOSIS — W06XXXA Fall from bed, initial encounter: Secondary | ICD-10-CM | POA: Insufficient documentation

## 2021-04-03 DIAGNOSIS — N390 Urinary tract infection, site not specified: Secondary | ICD-10-CM | POA: Insufficient documentation

## 2021-04-03 LAB — CBC WITH DIFFERENTIAL/PLATELET
Abs Immature Granulocytes: 0.04 10*3/uL (ref 0.00–0.07)
Basophils Absolute: 0.1 10*3/uL (ref 0.0–0.1)
Basophils Relative: 1 %
Eosinophils Absolute: 0.4 10*3/uL (ref 0.0–0.5)
Eosinophils Relative: 4 %
HCT: 43.9 % (ref 36.0–46.0)
Hemoglobin: 13.8 g/dL (ref 12.0–15.0)
Immature Granulocytes: 0 %
Lymphocytes Relative: 29 %
Lymphs Abs: 3.2 10*3/uL (ref 0.7–4.0)
MCH: 27.9 pg (ref 26.0–34.0)
MCHC: 31.4 g/dL (ref 30.0–36.0)
MCV: 88.7 fL (ref 80.0–100.0)
Monocytes Absolute: 0.8 10*3/uL (ref 0.1–1.0)
Monocytes Relative: 8 %
Neutro Abs: 6.6 10*3/uL (ref 1.7–7.7)
Neutrophils Relative %: 58 %
Platelets: 310 10*3/uL (ref 150–400)
RBC: 4.95 MIL/uL (ref 3.87–5.11)
RDW: 15.6 % — ABNORMAL HIGH (ref 11.5–15.5)
WBC: 11.1 10*3/uL — ABNORMAL HIGH (ref 4.0–10.5)
nRBC: 0 % (ref 0.0–0.2)

## 2021-04-03 LAB — URINALYSIS, COMPLETE (UACMP) WITH MICROSCOPIC
Bilirubin Urine: NEGATIVE
Glucose, UA: NEGATIVE mg/dL
Ketones, ur: 5 mg/dL — AB
Nitrite: NEGATIVE
Protein, ur: 30 mg/dL — AB
Specific Gravity, Urine: 1.012 (ref 1.005–1.030)
WBC, UA: >50 WBC/hpf — ABNORMAL HIGH (ref 0–5)
pH: 5 (ref 5.0–8.0)

## 2021-04-03 LAB — BASIC METABOLIC PANEL
Anion gap: 14 (ref 5–15)
BUN: 14 mg/dL (ref 8–23)
CO2: 30 mmol/L (ref 22–32)
Calcium: 9.2 mg/dL (ref 8.9–10.3)
Chloride: 99 mmol/L (ref 98–111)
Creatinine, Ser: 0.72 mg/dL (ref 0.44–1.00)
GFR, Estimated: >60 mL/min (ref >60)
Glucose, Bld: 124 mg/dL — ABNORMAL HIGH (ref 70–99)
Potassium: 3.8 mmol/L (ref 3.5–5.1)
Sodium: 143 mmol/L (ref 135–145)

## 2021-04-03 LAB — TROPONIN I (HIGH SENSITIVITY): Troponin I (High Sensitivity): 8 ng/L (ref <18)

## 2021-04-03 MED ORDER — FOSFOMYCIN TROMETHAMINE 3 G PO PACK
3.0000 g | PACK | Freq: Once | ORAL | Status: AC
  Administered 2021-04-04: 3 g via ORAL
  Filled 2021-04-03 (×2): qty 3

## 2021-04-03 NOTE — ED Triage Notes (Signed)
Pt arrives to ED from [facility] via [place] with c/c of unwitness fall from bed. Pt found laying facedown. EMS reports patient was still laying facedown when they arrived. EMS reports pt conscious but confused. Transport vitals reported as 120/76, p 102, R 18, O2 sat 98% on 2L via nasal canula. Pt is on 2L O2 at home. Upon arrival, Pt alert and oriented to self only. Unable to accurately relate DoB, cuirrent date, or awareness of location.

## 2021-04-03 NOTE — ED Provider Notes (Addendum)
[facility] Emergency Department Provider Note   ____________________________________________   I have reviewed the triage vital signs and the nursing notes.   HISTORY  Chief Complaint Fall and Facial Injury (Bruising to left cheek)   History limited by and level 5 caveat due to: Poor historian  HPI Molly Hawkins is a 85 y.o. female who presents to the emergency department today from living facility after an unwitnissed fall. The patient was found lying face down next to her bed. She was noted to have a bruise to her left cheek. The patient herself cannot give any significant history but does state she is having some pain to her right lower chest.    Records reviewed. Per medical record review patient has a history of DM, HLD, DVT.   Past Medical History:  Diagnosis Date  . Arthritis   . Diabetes mellitus without complication ([place])   . DVT (deep venous thrombosis) ([place])   . Hyperlipidemia   . Ulcer of foot [facility])     Patient Active Problem List   Diagnosis Date Noted  . Hay fever 05/24/2015  . Essential (primary) hypertension 05/24/2015  . Hypercholesteremia 05/24/2015  . B12 deficiency 05/24/2015  . Basal cell carcinoma of face 10/04/2014  . Broken humerus 04/03/2014  . Closed fracture of neck of right femur ([place]) 10/03/2013  . Blood glucose elevated 05/31/2013  . Carotid artery plaque 04/25/2013  . Vascular disorder of lower extremity 03/22/2012  . Long term current use of anticoagulant 02/22/2012  . Avitaminosis D 02/22/2012    Past Surgical History:  Procedure Laterality Date  . ABDOMINAL HYSTERECTOMY  1967  . BLADDER SURGERY  1960s  . BREAST BIOPSY Left    abcess drained under arm  . broken ankle  1985  . broken arm Left 2015  . CATARACT EXTRACTION  [id number]  . PARTIAL HIP ARTHROPLASTY  2014  . URETERAL EXPLORATION  1960s   removed stricture/scar tissue    Prior to Admission medications   Medication Sig Start Date End Date  Taking? Authorizing Provider  CALCIUM CITRATE-VITAMIN D3 PO Take 1 tablet by mouth daily at 6 (six) AM. 06/14/14   [provider]  lisinopril (PRINIVIL,ZESTRIL) 20 MG tablet Take 1 tablet by mouth daily. 05/28/14   [provider]  vitamin B-12 (CYANOCOBALAMIN) 100 MCG tablet Take 100 mcg by mouth daily.    [provider]  warfarin (COUMADIN) 3 MG tablet Take 1 tablet by mouth 4 (four) times a week. 05/28/14   [provider]  warfarin (COUMADIN) 4 MG tablet Take 4 mg by mouth 3 (three) times a week.    [provider]    Allergies Atorvastatin, Codeine, Ezetimibe, and Statins  Family History  Problem Relation Age of Onset  . Bladder Cancer Son   . Diabetes Son   . Breast cancer Neg Hx     Social History Social History   Tobacco Use  . Smoking status: Never Smoker  . Smokeless tobacco: Never Used  Substance Use Topics  . Alcohol use: No  . Drug use: No    Review of Systems Unable to obtain reliable ROS secondary to dementia.  ____________________________________________   PHYSICAL EXAM:  VITAL SIGNS: ED Triage Vitals  Enc Vitals Group     BP 04/03/21 2020 135/61     Pulse Rate 04/03/21 2020 (!) 109     Resp 04/03/21 2020 16     Temp 04/03/21 2020 97.9 F (36.6 C)     Temp  Source 04/03/21 2020 Oral     SpO2 04/03/21 2020 100 %     Weight 04/03/21 2022 160 lb (72.6 kg)     Height 04/03/21 2022 5\' 5"  (1.651 m)    Constitutional: Awake and alert. Oriented to name.  Eyes: Conjunctivae are normal.  ENT      Head: Normocephalic. Small bruise to left cheek.       Nose: No congestion/rhinnorhea.      Mouth/Throat: Mucous membranes are moist.      Neck: No stridor. No midline tenderness. Hematological/Lymphatic/Immunilogical: No cervical lymphadenopathy. Cardiovascular: Normal rate, regular rhythm.  No murmurs, rubs, or gallops.  Respiratory: Normal respiratory effort without tachypnea nor retractions. Breath sounds are clear  and equal bilaterally. No wheezes/rales/rhonchi. Gastrointestinal: Soft and non tender. No rebound. No guarding.  Genitourinary: Deferred Musculoskeletal: Normal range of motion in all extremities. No tenderness or deformity of the upper or lower extremities.  No lower extremity edema. Neurologic: Dementia. Moving all extremities.  Skin:  Skin is warm, dry and intact. No rash noted.  ____________________________________________    LABS (pertinent positives/negatives)  Trop hs 8 BMP wnl except glu 124 UA cloudy, large hgb dipstick, ketones 5, large leukocytes, 21-50 RBC, >50 WBC CBC wbc 11.1, hgb 13.8, plt 310 ____________________________________________   EKG  I, Nance Pear, attending physician, personally viewed and interpreted this EKG  EKG Time: 2018 Rate: 113 Rhythm: atrial fibrillation vs sinus tachycardia Axis: left axis deviation Intervals: qtc 511 QRS: LVH ST changes: no st elevation Impression: abnormal ekg ____________________________________________    RADIOLOGY  CT head/cervical spine Age indeterminate CVA. No acute osseous abnormality  CXR No acute traumatic findings. No pneumonia  ____________________________________________   PROCEDURES  Procedures  ____________________________________________   INITIAL IMPRESSION / ASSESSMENT AND PLAN / ED COURSE  Pertinent labs & imaging results that were available during my care of the patient were reviewed by me and considered in my medical decision making (see chart for details).   Patient presented to the emergency department today because of concerns for an unwitnessed fall.  On exam patient is complaining of some right-sided chest pain.  Chest x-ray without any acute osseous injury or pneumothorax.  Head CT and cervical spine CT were obtained.  Did show an age-indeterminate CVA but no acute stroke or bleed.  No acute osseous abnormality.  Urine is concerning for UTI.  Patient will be given dose of  fosfomycin here.  Will plan on discharge back to facility.  Discussed with patient's daughter who came to the ER. She did state that the patient's speech seemed slightly off. I did offer to obtain MRI given history of CVA (daughter states that she had CVA in December). However at this time daughter felt comfortable deferring MRI. Discussed repeating UA.  ___________________________________________   FINAL CLINICAL IMPRESSION(S) / ED DIAGNOSES  Final diagnoses:  Fall, initial encounter  Lower urinary tract infectious disease     Note: This dictation was prepared with Diplomatic Services operational officer dictation. Any transcriptional errors that result from this process are unintentional     Nance Pear, MD 04/04/21 0007    Nance Pear, MD 04/04/21 0030

## 2021-04-03 NOTE — ED Notes (Signed)
Upon initial assessment, approx 8cm stage one pressure ulcer noted on patients sacral area. Site cleaned and sacral dressing placed.

## 2021-04-03 NOTE — Discharge Instructions (Addendum)
Please have Ms. Molly Hawkins's urine rechecked in the next 3-4 days. Please seek medical attention for any high fevers, chest pain, shortness of breath, change in behavior, persistent vomiting, bloody stool or any other new or concerning symptoms.

## 2021-04-03 NOTE — ED Provider Notes (Incomplete)
[facility] Emergency Department Provider Note   ____________________________________________   I have reviewed the triage vital signs and the nursing notes.   HISTORY  Chief Complaint Fall and Facial Injury (Bruising to left cheek)   History limited by and level 5 caveat due to: Poor historian  HPI Molly Hawkins is a 85 y.o. female who presents to the emergency department today from living facility after an unwitnissed fall. The patient was found lying face down next to her bed. She was noted to have a bruise to her left cheek. The patient herself cannot give any significant history but does state she is having some pain to her right lower chest.    Records reviewed. Per medical record review patient has a history of DM, HLD, DVT.   Past Medical History:  Diagnosis Date  . Arthritis   . Diabetes mellitus without complication ([place])   . DVT (deep venous thrombosis) ([place])   . Hyperlipidemia   . Ulcer of foot [facility])     Patient Active Problem List   Diagnosis Date Noted  . Hay fever 05/24/2015  . Essential (primary) hypertension 05/24/2015  . Hypercholesteremia 05/24/2015  . B12 deficiency 05/24/2015  . Basal cell carcinoma of face 10/04/2014  . Broken humerus 04/03/2014  . Closed fracture of neck of right femur ([place]) 10/03/2013  . Blood glucose elevated 05/31/2013  . Carotid artery plaque 04/25/2013  . Vascular disorder of lower extremity 03/22/2012  . Long term current use of anticoagulant 02/22/2012  . Avitaminosis D 02/22/2012    Past Surgical History:  Procedure Laterality Date  . ABDOMINAL HYSTERECTOMY  1967  . BLADDER SURGERY  1960s  . BREAST BIOPSY Left    abcess drained under arm  . broken ankle  1985  . broken arm Left 2015  . CATARACT EXTRACTION  [id number]  . PARTIAL HIP ARTHROPLASTY  2014  . URETERAL EXPLORATION  1960s   removed stricture/scar tissue    Prior to Admission medications   Medication Sig Start Date End Date  Taking? Authorizing Provider  CALCIUM CITRATE-VITAMIN D3 PO Take 1 tablet by mouth daily at 6 (six) AM. 06/14/14   [provider]  lisinopril (PRINIVIL,ZESTRIL) 20 MG tablet Take 1 tablet by mouth daily. 05/28/14   [provider]  vitamin B-12 (CYANOCOBALAMIN) 100 MCG tablet Take 100 mcg by mouth daily.    [provider]  warfarin (COUMADIN) 3 MG tablet Take 1 tablet by mouth 4 (four) times a week. 05/28/14   [provider]  warfarin (COUMADIN) 4 MG tablet Take 4 mg by mouth 3 (three) times a week.    [provider]    Allergies Atorvastatin, Codeine, Ezetimibe, and Statins  Family History  Problem Relation Age of Onset  . Bladder Cancer Son   . Diabetes Son   . Breast cancer Neg Hx     Social History Social History   Tobacco Use  . Smoking status: Never Smoker  . Smokeless tobacco: Never Used  Substance Use Topics  . Alcohol use: No  . Drug use: No    Review of Systems Unable to obtain reliable ROS secondary to dementia.  ____________________________________________   PHYSICAL EXAM:  VITAL SIGNS: ED Triage Vitals  Enc Vitals Group     BP 04/03/21 2020 135/61     Pulse Rate 04/03/21 2020 (!) 109     Resp 04/03/21 2020 16     Temp 04/03/21 2020 97.9 F (36.6 C)     Temp  Source 04/03/21 2020 Oral     SpO2 04/03/21 2020 100 %     Weight 04/03/21 2022 160 lb (72.6 kg)     Height 04/03/21 2022 5\' 5"  (1.651 m)    Constitutional: Alert and oriented.  Eyes: Conjunctivae are normal.  ENT      Head: Normocephalic. Small bruise to left cheek.       Nose: No congestion/rhinnorhea.      Mouth/Throat: Mucous membranes are moist.      Neck: No stridor. No midline tenderness. Hematological/Lymphatic/Immunilogical: No cervical lymphadenopathy. Cardiovascular: Normal rate, regular rhythm.  No murmurs, rubs, or gallops.  Respiratory: Normal respiratory effort without tachypnea nor retractions. Breath sounds are clear and equal  bilaterally. No wheezes/rales/rhonchi. Gastrointestinal: Soft and non tender. No rebound. No guarding.  Genitourinary: Deferred Musculoskeletal: Normal range of motion in all extremities. No lower extremity edema. Neurologic:  Normal speech and language. No gross focal neurologic deficits are appreciated.  Skin:  Skin is warm, dry and intact. No rash noted. Psychiatric: Mood and affect are normal. Speech and behavior are normal. Patient exhibits appropriate insight and judgment.  ____________________________________________    LABS (pertinent positives/negatives)  {Name/None:19197::"None"}  ____________________________________________   EKG  I, Nance Pear, attending physician, personally viewed and interpreted this EKG  EKG Time: 2018 Rate: 113 Rhythm: atrial fibrillation vs sinus tachycardia Axis: left axis deviation Intervals: qtc 511 QRS: LVH ST changes: no st elevation Impression: abnormal ekg ____________________________________________    RADIOLOGY  {Name/None:19197::"None"}  {**I, Nance Pear, personally viewed and evaluated these images (plain radiographs) as part of my medical decision making.**} ____________________________________________   PROCEDURES  Procedures  ____________________________________________   INITIAL IMPRESSION / ASSESSMENT AND PLAN / ED COURSE  Pertinent labs & imaging results that were available during my care of the patient were reviewed by me and considered in my medical decision making (see chart for details).   ***  Discussed with patient/family results of testing/physical exam, differential plan and return precautions.  ____________________________________________   FINAL CLINICAL IMPRESSION(S) / ED DIAGNOSES  Final diagnoses:  None     Note: This dictation was prepared with Dragon dictation. Any transcriptional errors that result from this process are unintentional

## 2021-04-04 ENCOUNTER — Emergency Department: Payer: Medicare Other

## 2021-04-04 ENCOUNTER — Other Ambulatory Visit: Payer: Self-pay

## 2021-04-04 ENCOUNTER — Inpatient Hospital Stay
Admission: EM | Admit: 2021-04-04 | Discharge: 2021-04-11 | DRG: 871 | Disposition: A | Discharge status: Hospice | Payer: Medicare Other | Source: Skilled Nursing Facility | Attending: Internal Medicine | Admitting: Internal Medicine

## 2021-04-04 ENCOUNTER — Inpatient Hospital Stay: Payer: Medicare Other

## 2021-04-04 DIAGNOSIS — Z86718 Personal history of other venous thrombosis and embolism: Secondary | ICD-10-CM

## 2021-04-04 DIAGNOSIS — L89152 Pressure ulcer of sacral region, stage 2: Secondary | ICD-10-CM | POA: Diagnosis present

## 2021-04-04 DIAGNOSIS — I10 Essential (primary) hypertension: Secondary | ICD-10-CM | POA: Diagnosis present

## 2021-04-04 DIAGNOSIS — J9692 Respiratory failure, unspecified with hypercapnia: Secondary | ICD-10-CM | POA: Diagnosis not present

## 2021-04-04 DIAGNOSIS — Z9181 History of falling: Secondary | ICD-10-CM

## 2021-04-04 DIAGNOSIS — A411 Sepsis due to other specified staphylococcus: Secondary | ICD-10-CM | POA: Diagnosis present

## 2021-04-04 DIAGNOSIS — E43 Unspecified severe protein-calorie malnutrition: Secondary | ICD-10-CM | POA: Insufficient documentation

## 2021-04-04 DIAGNOSIS — R0902 Hypoxemia: Secondary | ICD-10-CM

## 2021-04-04 DIAGNOSIS — Z978 Presence of other specified devices: Secondary | ICD-10-CM

## 2021-04-04 DIAGNOSIS — E11649 Type 2 diabetes mellitus with hypoglycemia without coma: Secondary | ICD-10-CM | POA: Diagnosis not present

## 2021-04-04 DIAGNOSIS — Z20822 Contact with and (suspected) exposure to covid-19: Secondary | ICD-10-CM | POA: Diagnosis present

## 2021-04-04 DIAGNOSIS — R6521 Severe sepsis with septic shock: Secondary | ICD-10-CM | POA: Diagnosis present

## 2021-04-04 DIAGNOSIS — N390 Urinary tract infection, site not specified: Secondary | ICD-10-CM | POA: Diagnosis not present

## 2021-04-04 DIAGNOSIS — J690 Pneumonitis due to inhalation of food and vomit: Secondary | ICD-10-CM | POA: Diagnosis present

## 2021-04-04 DIAGNOSIS — Z79899 Other long term (current) drug therapy: Secondary | ICD-10-CM | POA: Diagnosis not present

## 2021-04-04 DIAGNOSIS — Z8052 Family history of malignant neoplasm of bladder: Secondary | ICD-10-CM

## 2021-04-04 DIAGNOSIS — G9341 Metabolic encephalopathy: Secondary | ICD-10-CM | POA: Diagnosis present

## 2021-04-04 DIAGNOSIS — I480 Paroxysmal atrial fibrillation: Secondary | ICD-10-CM | POA: Diagnosis present

## 2021-04-04 DIAGNOSIS — R0603 Acute respiratory distress: Secondary | ICD-10-CM

## 2021-04-04 DIAGNOSIS — Z4659 Encounter for fitting and adjustment of other gastrointestinal appliance and device: Secondary | ICD-10-CM

## 2021-04-04 DIAGNOSIS — W19XXXA Unspecified fall, initial encounter: Secondary | ICD-10-CM | POA: Diagnosis present

## 2021-04-04 DIAGNOSIS — Z833 Family history of diabetes mellitus: Secondary | ICD-10-CM

## 2021-04-04 DIAGNOSIS — J9601 Acute respiratory failure with hypoxia: Secondary | ICD-10-CM | POA: Diagnosis present

## 2021-04-04 DIAGNOSIS — R52 Pain, unspecified: Secondary | ICD-10-CM

## 2021-04-04 DIAGNOSIS — S0083XA Contusion of other part of head, initial encounter: Secondary | ICD-10-CM | POA: Diagnosis present

## 2021-04-04 DIAGNOSIS — L8990 Pressure ulcer of unspecified site, unspecified stage: Secondary | ICD-10-CM | POA: Insufficient documentation

## 2021-04-04 DIAGNOSIS — R569 Unspecified convulsions: Secondary | ICD-10-CM | POA: Diagnosis present

## 2021-04-04 DIAGNOSIS — R0602 Shortness of breath: Secondary | ICD-10-CM

## 2021-04-04 DIAGNOSIS — Z515 Encounter for palliative care: Secondary | ICD-10-CM | POA: Diagnosis not present

## 2021-04-04 DIAGNOSIS — F0390 Unspecified dementia without behavioral disturbance: Secondary | ICD-10-CM | POA: Diagnosis present

## 2021-04-04 DIAGNOSIS — I69334 Monoplegia of upper limb following cerebral infarction affecting left non-dominant side: Secondary | ICD-10-CM

## 2021-04-04 DIAGNOSIS — N3090 Cystitis, unspecified without hematuria: Secondary | ICD-10-CM | POA: Diagnosis present

## 2021-04-04 DIAGNOSIS — M810 Age-related osteoporosis without current pathological fracture: Secondary | ICD-10-CM | POA: Diagnosis present

## 2021-04-04 DIAGNOSIS — Z7901 Long term (current) use of anticoagulants: Secondary | ICD-10-CM | POA: Diagnosis not present

## 2021-04-04 DIAGNOSIS — E8770 Fluid overload, unspecified: Secondary | ICD-10-CM | POA: Diagnosis present

## 2021-04-04 DIAGNOSIS — Z66 Do not resuscitate: Secondary | ICD-10-CM | POA: Diagnosis present

## 2021-04-04 DIAGNOSIS — J189 Pneumonia, unspecified organism: Secondary | ICD-10-CM

## 2021-04-04 DIAGNOSIS — L8995 Pressure ulcer of unspecified site, unstageable: Secondary | ICD-10-CM | POA: Diagnosis not present

## 2021-04-04 DIAGNOSIS — E785 Hyperlipidemia, unspecified: Secondary | ICD-10-CM | POA: Diagnosis present

## 2021-04-04 DIAGNOSIS — R000 Tachycardia, unspecified: Secondary | ICD-10-CM | POA: Diagnosis present

## 2021-04-04 DIAGNOSIS — Z7189 Other specified counseling: Secondary | ICD-10-CM | POA: Diagnosis not present

## 2021-04-04 DIAGNOSIS — Z6825 Body mass index (BMI) 25.0-25.9, adult: Secondary | ICD-10-CM

## 2021-04-04 LAB — TYPE AND SCREEN
ABO/RH(D): A POS
Antibody Screen: NEGATIVE

## 2021-04-04 LAB — CBC WITH DIFFERENTIAL/PLATELET
Abs Immature Granulocytes: 0.05 10*3/uL (ref 0.00–0.07)
Basophils Absolute: 0.1 10*3/uL (ref 0.0–0.1)
Basophils Relative: 0 %
Eosinophils Absolute: 0.3 10*3/uL (ref 0.0–0.5)
Eosinophils Relative: 2 %
HCT: 43.5 % (ref 36.0–46.0)
Hemoglobin: 13.7 g/dL (ref 12.0–15.0)
Immature Granulocytes: 0 %
Lymphocytes Relative: 28 %
Lymphs Abs: 3.8 10*3/uL (ref 0.7–4.0)
MCH: 27.9 pg (ref 26.0–34.0)
MCHC: 31.5 g/dL (ref 30.0–36.0)
MCV: 88.6 fL (ref 80.0–100.0)
Monocytes Absolute: 1.2 10*3/uL — ABNORMAL HIGH (ref 0.1–1.0)
Monocytes Relative: 8 %
Neutro Abs: 8.4 10*3/uL — ABNORMAL HIGH (ref 1.7–7.7)
Neutrophils Relative %: 62 %
Platelets: 340 10*3/uL (ref 150–400)
RBC: 4.91 MIL/uL (ref 3.87–5.11)
RDW: 15.8 % — ABNORMAL HIGH (ref 11.5–15.5)
WBC: 13.8 10*3/uL — ABNORMAL HIGH (ref 4.0–10.5)
nRBC: 0 % (ref 0.0–0.2)

## 2021-04-04 LAB — URINALYSIS, COMPLETE (UACMP) WITH MICROSCOPIC
Bilirubin Urine: NEGATIVE
Glucose, UA: NEGATIVE mg/dL
Ketones, ur: 5 mg/dL — AB
Nitrite: NEGATIVE
Protein, ur: 100 mg/dL — AB
RBC / HPF: >50 RBC/hpf — ABNORMAL HIGH (ref 0–5)
Specific Gravity, Urine: 1.014 (ref 1.005–1.030)
WBC, UA: >50 WBC/hpf — ABNORMAL HIGH (ref 0–5)
pH: 5 (ref 5.0–8.0)

## 2021-04-04 LAB — RESP PANEL BY RT-PCR (FLU A&B, COVID) ARPGX2
Influenza A by PCR: NEGATIVE
Influenza B by PCR: NEGATIVE
SARS Coronavirus 2 by RT PCR: NEGATIVE

## 2021-04-04 LAB — BLOOD GAS, VENOUS
Acid-Base Excess: 4.2 mmol/L — ABNORMAL HIGH (ref 0.0–2.0)
Bicarbonate: 31.5 mmol/L — ABNORMAL HIGH (ref 20.0–28.0)
FIO2: 100
O2 Saturation: 86.5 %
Patient temperature: 37
pCO2, Ven: 57 mmHg (ref 44.0–60.0)
pH, Ven: 7.35 (ref 7.250–7.430)
pO2, Ven: 55 mmHg — ABNORMAL HIGH (ref 32.0–45.0)

## 2021-04-04 LAB — STREP PNEUMONIAE URINARY ANTIGEN: Strep Pneumo Urinary Antigen: NEGATIVE

## 2021-04-04 LAB — COMPREHENSIVE METABOLIC PANEL
ALT: 11 U/L (ref 0–44)
AST: 20 U/L (ref 15–41)
Albumin: 3 g/dL — ABNORMAL LOW (ref 3.5–5.0)
Alkaline Phosphatase: 101 U/L (ref 38–126)
Anion gap: 14 (ref 5–15)
BUN: 16 mg/dL (ref 8–23)
CO2: 28 mmol/L (ref 22–32)
Calcium: 9.4 mg/dL (ref 8.9–10.3)
Chloride: 101 mmol/L (ref 98–111)
Creatinine, Ser: 0.74 mg/dL (ref 0.44–1.00)
GFR, Estimated: >60 mL/min (ref >60)
Glucose, Bld: 148 mg/dL — ABNORMAL HIGH (ref 70–99)
Potassium: 3.7 mmol/L (ref 3.5–5.1)
Sodium: 143 mmol/L (ref 135–145)
Total Bilirubin: 0.9 mg/dL (ref 0.3–1.2)
Total Protein: 7.3 g/dL (ref 6.5–8.1)

## 2021-04-04 LAB — LACTIC ACID, PLASMA
Lactic Acid, Venous: 1.9 mmol/L (ref 0.5–1.9)
Lactic Acid, Venous: 3.7 mmol/L (ref 0.5–1.9)
Lactic Acid, Venous: 5.2 mmol/L (ref 0.5–1.9)
Lactic Acid, Venous: 3.9 mmol/L (ref 0.5–1.9)
Lactic Acid, Venous: 2.2 mmol/L (ref 0.5–1.9)
Lactic Acid, Venous: 1.9 mmol/L (ref 0.5–1.9)

## 2021-04-04 LAB — BRAIN NATRIURETIC PEPTIDE: B Natriuretic Peptide: 69.8 pg/mL (ref 0.0–100.0)

## 2021-04-04 LAB — BLOOD GAS, ARTERIAL
Acid-Base Excess: 3.9 mmol/L — ABNORMAL HIGH (ref 0.0–2.0)
Acid-Base Excess: 1.1 mmol/L (ref 0.0–2.0)
Bicarbonate: 27.7 mmol/L (ref 20.0–28.0)
Bicarbonate: 25.1 mmol/L (ref 20.0–28.0)
FIO2: 1
FIO2: 0.7
MECHVT: 400 mL
MECHVT: 400 mL
Mechanical Rate: 22
O2 Saturation: 90.7 %
O2 Saturation: 98.6 %
PEEP: 10 cmH2O
PEEP: 8 cmH2O
Patient temperature: 37
Patient temperature: 37
RATE: 22 resp/min
RATE: 22 resp/min
pCO2 arterial: 38 mmHg (ref 32.0–48.0)
pCO2 arterial: 37 mmHg (ref 32.0–48.0)
pH, Arterial: 7.47 — ABNORMAL HIGH (ref 7.350–7.450)
pH, Arterial: 7.44 (ref 7.350–7.450)
pO2, Arterial: 56 mmHg — ABNORMAL LOW (ref 83.0–108.0)
pO2, Arterial: 115 mmHg — ABNORMAL HIGH (ref 83.0–108.0)

## 2021-04-04 LAB — GLUCOSE, CAPILLARY
Glucose-Capillary: 157 mg/dL — ABNORMAL HIGH (ref 70–99)
Glucose-Capillary: 159 mg/dL — ABNORMAL HIGH (ref 70–99)
Glucose-Capillary: 153 mg/dL — ABNORMAL HIGH (ref 70–99)

## 2021-04-04 LAB — TROPONIN I (HIGH SENSITIVITY)
Troponin I (High Sensitivity): 15 ng/L (ref <18)
Troponin I (High Sensitivity): 8 ng/L (ref <18)

## 2021-04-04 LAB — PROTIME-INR
INR: 1.2 (ref 0.8–1.2)
Prothrombin Time: 15 seconds (ref 11.4–15.2)

## 2021-04-04 LAB — MRSA PCR SCREENING: MRSA by PCR: POSITIVE — AB

## 2021-04-04 LAB — CK: Total CK: 30 U/L — ABNORMAL LOW (ref 38–234)

## 2021-04-04 LAB — PROCALCITONIN: Procalcitonin: <0.1 ng/mL

## 2021-04-04 MED ORDER — MIDAZOLAM HCL 2 MG/2ML IJ SOLN
1.0000 mg | INTRAMUSCULAR | Status: DC | PRN
  Administered 2021-04-04: 1 mg via INTRAVENOUS
  Filled 2021-04-04: qty 2

## 2021-04-04 MED ORDER — NOREPINEPHRINE 4 MG/250ML-% IV SOLN
0.0000 ug/min | INTRAVENOUS | Status: DC
  Administered 2021-04-04: 2 ug/min via INTRAVENOUS

## 2021-04-04 MED ORDER — SODIUM CHLORIDE 0.9 % IV SOLN
500.0000 mg | Freq: Once | INTRAVENOUS | Status: AC
  Administered 2021-04-04: 500 mg via INTRAVENOUS
  Filled 2021-04-04: qty 500

## 2021-04-04 MED ORDER — SODIUM CHLORIDE 0.9 % IV SOLN
2.0000 g | Freq: Two times a day (BID) | INTRAVENOUS | Status: DC
  Administered 2021-04-04 – 2021-04-07 (×6): 2 g via INTRAVENOUS
  Filled 2021-04-04 (×7): qty 2

## 2021-04-04 MED ORDER — DOCUSATE SODIUM 100 MG PO CAPS: 100.0000 mg | ORAL_CAPSULE | Freq: Two times a day (BID) | ORAL | Status: DC | PRN

## 2021-04-04 MED ORDER — DOCUSATE SODIUM 50 MG/5ML PO LIQD
100.0000 mg | Freq: Two times a day (BID) | ORAL | Status: DC
  Administered 2021-04-04 – 2021-04-05 (×2): 100 mg
  Filled 2021-04-04 (×2): qty 10

## 2021-04-04 MED ORDER — MIDAZOLAM HCL 2 MG/2ML IJ SOLN: 2.0000 mg | INTRAMUSCULAR | Status: DC | PRN

## 2021-04-04 MED ORDER — IPRATROPIUM-ALBUTEROL 0.5-2.5 (3) MG/3ML IN SOLN: 3.0000 mL | Freq: Four times a day (QID) | RESPIRATORY_TRACT | Status: DC | PRN

## 2021-04-04 MED ORDER — SODIUM CHLORIDE 0.9 % IV BOLUS
1000.0000 mL | Freq: Once | INTRAVENOUS | Status: AC
  Administered 2021-04-04: 1000 mL via INTRAVENOUS

## 2021-04-04 MED ORDER — SODIUM CHLORIDE 0.9 % IV SOLN
250.0000 mL | INTRAVENOUS | Status: DC
  Administered 2021-04-04: 250 mL via INTRAVENOUS

## 2021-04-04 MED ORDER — LEVETIRACETAM 100 MG/ML PO SOLN
750.0000 mg | Freq: Two times a day (BID) | ORAL | Status: DC
  Administered 2021-04-04 – 2021-04-05 (×3): 750 mg
  Filled 2021-04-04 (×5): qty 7.5

## 2021-04-04 MED ORDER — ROCURONIUM BROMIDE 50 MG/5ML IV SOLN
75.0000 mg | Freq: Once | INTRAVENOUS | Status: AC
  Administered 2021-04-04: 75 mg via INTRAVENOUS
  Filled 2021-04-04: qty 7.5

## 2021-04-04 MED ORDER — VANCOMYCIN HCL 1500 MG/300ML IV SOLN
1500.0000 mg | Freq: Once | INTRAVENOUS | Status: AC
  Administered 2021-04-04: 1500 mg via INTRAVENOUS
  Filled 2021-04-04 (×2): qty 300

## 2021-04-04 MED ORDER — PROPOFOL 1000 MG/100ML IV EMUL
INTRAVENOUS | Status: AC
  Administered 2021-04-04: 5 ug/kg/min via INTRAVENOUS
  Filled 2021-04-04: qty 100

## 2021-04-04 MED ORDER — CEFEPIME HCL 2 G IJ SOLR
2.0000 g | Freq: Once | INTRAMUSCULAR | Status: AC
  Administered 2021-04-04: 2 g via INTRAVENOUS
  Filled 2021-04-04: qty 2

## 2021-04-04 MED ORDER — FUROSEMIDE 10 MG/ML IJ SOLN
40.0000 mg | Freq: Once | INTRAMUSCULAR | Status: AC
  Administered 2021-04-04: 40 mg via INTRAVENOUS
  Filled 2021-04-04: qty 4

## 2021-04-04 MED ORDER — SODIUM CHLORIDE 0.9 % IV SOLN: 1.0000 g | Freq: Once | INTRAVENOUS | Status: DC

## 2021-04-04 MED ORDER — FENTANYL 2500MCG IN NS 250ML (10MCG/ML) PREMIX INFUSION
0.0000 ug/h | INTRAVENOUS | Status: DC
  Administered 2021-04-04: 50 ug/h via INTRAVENOUS
  Filled 2021-04-04: qty 250

## 2021-04-04 MED ORDER — LACTATED RINGERS IV BOLUS
1000.0000 mL | Freq: Once | INTRAVENOUS | Status: AC
  Administered 2021-04-04: 1000 mL via INTRAVENOUS

## 2021-04-04 MED ORDER — FENTANYL 2500MCG IN NS 250ML (10MCG/ML) PREMIX INFUSION: 25.0000 ug/h | INTRAVENOUS | Status: DC

## 2021-04-04 MED ORDER — KETAMINE HCL 10 MG/ML IJ SOLN
75.0000 mg | Freq: Once | INTRAMUSCULAR | Status: AC
  Administered 2021-04-04: 75 mg via INTRAVENOUS

## 2021-04-04 MED ORDER — DOCUSATE SODIUM 50 MG/5ML PO LIQD: 100.0000 mg | Freq: Two times a day (BID) | ORAL | Status: DC | PRN

## 2021-04-04 MED ORDER — NOREPINEPHRINE 4 MG/250ML-% IV SOLN
INTRAVENOUS | Status: AC
  Filled 2021-04-04: qty 250

## 2021-04-04 MED ORDER — VANCOMYCIN HCL 1750 MG/350ML IV SOLN
1750.0000 mg | Freq: Once | INTRAVENOUS | Status: DC
  Filled 2021-04-04: qty 350

## 2021-04-04 MED ORDER — FAMOTIDINE IN NACL 20-0.9 MG/50ML-% IV SOLN
20.0000 mg | Freq: Two times a day (BID) | INTRAVENOUS | Status: DC
  Administered 2021-04-05 – 2021-04-07 (×6): 20 mg via INTRAVENOUS
  Filled 2021-04-04 (×5): qty 50

## 2021-04-04 MED ORDER — POLYETHYLENE GLYCOL 3350 17 G PO PACK: 17.0000 g | PACK | Freq: Every day | ORAL | Status: DC | PRN

## 2021-04-04 MED ORDER — CHLORHEXIDINE GLUCONATE CLOTH 2 % EX PADS
6.0000 | MEDICATED_PAD | Freq: Every day | CUTANEOUS | Status: DC
  Administered 2021-04-06 – 2021-04-08 (×3): 6 via TOPICAL

## 2021-04-04 MED ORDER — CHLORHEXIDINE GLUCONATE 0.12% ORAL RINSE (MEDLINE KIT)
15.0000 mL | Freq: Two times a day (BID) | OROMUCOSAL | Status: DC
  Administered 2021-04-04 – 2021-04-08 (×8): 15 mL via OROMUCOSAL

## 2021-04-04 MED ORDER — FENTANYL CITRATE (PF) 100 MCG/2ML IJ SOLN: 25.0000 ug | Freq: Once | INTRAMUSCULAR | Status: DC

## 2021-04-04 MED ORDER — METOPROLOL TARTRATE 5 MG/5ML IV SOLN
2.5000 mg | Freq: Once | INTRAVENOUS | Status: AC
  Administered 2021-04-04: 2.5 mg via INTRAVENOUS
  Filled 2021-04-04: qty 5

## 2021-04-04 MED ORDER — APIXABAN 5 MG PO TABS
5.0000 mg | ORAL_TABLET | Freq: Two times a day (BID) | ORAL | Status: DC
  Administered 2021-04-04 – 2021-04-05 (×3): 5 mg
  Filled 2021-04-04 (×3): qty 1

## 2021-04-04 MED ORDER — ORAL CARE MOUTH RINSE
15.0000 mL | OROMUCOSAL | Status: DC
  Administered 2021-04-04 – 2021-04-08 (×41): 15 mL via OROMUCOSAL

## 2021-04-04 MED ORDER — PROPOFOL 1000 MG/100ML IV EMUL: 5.0000 ug/kg/min | INTRAVENOUS | Status: DC

## 2021-04-04 MED ORDER — SODIUM CHLORIDE 0.9 % IV SOLN
500.0000 mg | INTRAVENOUS | Status: DC
  Administered 2021-04-05 – 2021-04-07 (×3): 500 mg via INTRAVENOUS
  Filled 2021-04-04 (×3): qty 500

## 2021-04-04 MED ORDER — IOHEXOL 350 MG/ML SOLN
75.0000 mL | Freq: Once | INTRAVENOUS | Status: AC | PRN
  Administered 2021-04-04: 75 mL via INTRAVENOUS

## 2021-04-04 MED ORDER — VANCOMYCIN HCL IN DEXTROSE 1-5 GM/200ML-% IV SOLN: 1000.0000 mg | Freq: Once | INTRAVENOUS | Status: DC

## 2021-04-04 MED ORDER — PROPOFOL 1000 MG/100ML IV EMUL
0.0000 ug/kg/min | INTRAVENOUS | Status: DC
  Filled 2021-04-04: qty 100

## 2021-04-04 MED ORDER — FAMOTIDINE IN NACL 20-0.9 MG/50ML-% IV SOLN
20.0000 mg | Freq: Two times a day (BID) | INTRAVENOUS | Status: DC
  Administered 2021-04-04: 20 mg via INTRAVENOUS
  Filled 2021-04-04 (×2): qty 50

## 2021-04-04 MED ORDER — VANCOMYCIN HCL 1250 MG/250ML IV SOLN
1250.0000 mg | INTRAVENOUS | Status: DC
  Administered 2021-04-05 – 2021-04-07 (×3): 1250 mg via INTRAVENOUS
  Filled 2021-04-04 (×3): qty 250

## 2021-04-04 MED ORDER — SODIUM CHLORIDE 0.9 % IV BOLUS
500.0000 mL | Freq: Once | INTRAVENOUS | Status: AC
  Administered 2021-04-04: 500 mL via INTRAVENOUS

## 2021-04-04 MED ORDER — FENTANYL BOLUS VIA INFUSION
25.0000 ug | INTRAVENOUS | Status: DC | PRN
  Filled 2021-04-04: qty 100

## 2021-04-04 MED ORDER — LACTATED RINGERS IV BOLUS: 1000.0000 mL | Freq: Once | INTRAVENOUS | Status: DC

## 2021-04-04 MED ORDER — MUPIROCIN 2 % EX OINT
1.0000 "application " | TOPICAL_OINTMENT | Freq: Two times a day (BID) | CUTANEOUS | Status: DC
  Administered 2021-04-04 – 2021-04-07 (×8): 1 via NASAL
  Filled 2021-04-04 (×2): qty 22

## 2021-04-04 NOTE — H&P (Addendum)
NAME:  Molly Hawkins, MRN:  [id number], DOB:  1936-02-26, LOS: 0 ADMISSION DATE:  04/04/2021, CONSULTATION DATE: 04/04/2021 REFERRING MD: Marjean Donna, MD, CHIEF COMPLAINT: Shortness of breath   History of present illness    85 y.o. female with PMH a. Fib, DVT, CVA (multiple embolic strokes 77/9390 with residual left arm weakness [facility]), HTN, HLD, carotid stenosis, osteoporosis who presented [facility] the [facility] via [facility] with complaint of shortness of breath.  On review of her chart, patient initially presented to the ED yesterday  4/21 with concerns of unwitnessed fall and complaint of right chest sided chest pain.  She was apparently found lying face down next to her bed.  When EMS arrived patient was still lying facedown conscious but confused.  EMS reported vital signs: 120/76, p 102, R 18, O2 sat 98% on 2L via nasal canula. A  full work-up was done including chest x-ray which showed no acute osseous injury or pneumothorax.  Head CT and cervical spine did not show any acute abnormality other than an indeterminant CVA but daughter deferred MRI.  Urine however was concerning for UTI therefore patient was given a dose of fosfomycin and discharged back to the facility.  On arrival to the ED today 4/22, she was afebrile with blood pressure 138/63 mm Hg and pulse rate 117-121 beats/min. Per ED report, patient was found with sats in the low 60s upon EMS arrival and placed on BiPAP.  She was noted to be unresponsive with a GCS of 6 and therefore she was intubated for airway protection.  Initial pertinent labs revealed glucose 148, wbc13.8, lactic acid of 3.7 otherwise unremarkable lab work.  Repeat CT head and cervical spine was obtained which showed no acute intracranial abnormality or evidence of acute cervical spine fracture, traumatic subluxation or static signs of instability.  CT angio chest shows no evidence of pulmonary embolism however a right lower lobe opacity concerning for pneumonia was  noted, CT abdomen pelvis also showed no acute abnormality chronic right pelvic fractures was noted.  UA positive for UTI.  Patient required vasopressors upon intubation due to concerns for possible sepsis code sepsis was initiated and patient started on empiric antibiotics with cefepime, vancomycin and metronidazole.  PCCM consulted for further management.  Past Medical History  Atrial fibrillation DVT CVA (multiple embolic strokes 30/0923) with residual left arm weakness Carotid stenosis Osteoporosis Complicated cystitis Diabetes mellitus Arthritis Hyperlipidemia Foot ulcers  Significant Hospital Events   4/22: Admitted to PCCM service  Consults:  PCCM  Procedures:  4/22: Intubation  Significant Diagnostic Tests:  4/22: Chest Xray> 4/22: Noncontrast CT head>no acute intracranial abnormality  4/22: CT cervical Spine> No evidence of acute cervical spine fracture, traumatic subluxation or static signs of instability 4/22: CTA abdomen and pelvis > no acute abnormality, chronic right pelvic fractures was noted 4/22: CTA Chest>no evidence of pulmonary embolism however a right lower lobe opacity concerning for pneumonia was noted  Micro Data:  4/22: SARS-CoV-2 PCR>> negativ 4/22: Influenza PCR>> negative 4/22: Blood culture x2> pending 4/22: Urine Cx>pending 4/22: MRSA PCR> pending 4/22: Strep pneumo urinary antigen 4/22: Legionella urinary antigen 4/22: Mycoplasma pneumonia  Antimicrobials:   OBJECTIVE  Blood pressure 104/66, pulse (!) 108, temperature (!) 97.4 F (36.3 C), resp. rate (!) 22, SpO2 94 %.    Vent Mode: AC FiO2 (%):  [100 %] 100 % Set Rate:  [18 bmp] 18 bmp Vt Set:  [400 mL] 400 mL PEEP:  [10 cmH20] 10 cmH20  No intake or output data in the 24 hours ending 04/04/21 0757 There were no vitals filed for this visit.   Physical Examination  GENERAL: 85 year-old patient critically ill  lying in the bed intubated, mechanically ventilated and sedated EYES:  Pupils equal, round, reactive to light and accommodation. No scleral icterus. Extraocular muscles intact.  HEENT: Head atraumatic, normocephalic. Oropharynx and nasopharynx clear.  NECK:  Supple, no jugular venous distention. No thyroid enlargement, no tenderness.  LUNGS: Normal breath sounds bilaterally, no wheezing, rales,rhonchi or crepitation. No use of accessory muscles of respiration.  CARDIOVASCULAR: S1, S2 normal. No murmurs, rubs, or gallops.  ABDOMEN: Soft, nontender, nondistended. Bowel sounds present. No organomegaly or mass.  EXTREMITIES: No pedal edema, cyanosis, or clubbing.  NEUROLOGIC: Cranial nerves II through XII not assessed. Localized BUE to noxious stimuli, Widthdraw BLE to noxious stimuli.  Sensation intact. Gait not checked.  PSYCHIATRIC: Unable to assess SKIN: No obvious rash, lesion, or ulcer.   Labs/imaging that I havepersonally reviewed  (right click and "Reselect all SmartList Selections" daily)    Labs   CBC: Recent Labs  Lab 04/03/21 2038 04/04/21 0639  WBC 11.1* 13.8*  NEUTROABS 6.6 8.4*  HGB 13.8 13.7  HCT 43.9 43.5  MCV 88.7 88.6  PLT 310 099    Basic Metabolic Panel: Recent Labs  Lab 04/03/21 2038 04/04/21 0639  NA 143 143  K 3.8 3.7  CL 99 101  CO2 30 28  GLUCOSE 124* 148*  BUN 14 16  CREATININE 0.72 0.74  CALCIUM 9.2 9.4   GFR: Estimated Creatinine Clearance: 54.1 mL/min (by C-G formula based on SCr of 0.74 mg/dL). Recent Labs  Lab 04/03/21 2038 04/04/21 0639  WBC 11.1* 13.8*  LATICACIDVEN  --  1.9    Liver Function Tests: Recent Labs  Lab 04/04/21 0639  AST 20  ALT 11  ALKPHOS 101  BILITOT 0.9  PROT 7.3  ALBUMIN 3.0*   No results for input(s): LIPASE, AMYLASE in the last 168 hours. No results for input(s): AMMONIA in the last 168 hours.  ABG    Component Value Date/Time   HCO3 31.5 (H) 04/04/2021 0649   O2SAT 86.5 04/04/2021 0649     Coagulation Profile: Recent Labs  Lab 04/04/21 0639  INR 1.2     Cardiac Enzymes: Recent Labs  Lab 04/04/21 0630  CKTOTAL 30*    HbA1C: No results found for: HGBA1C  CBG: No results for input(s): GLUCAP in the last 168 hours.  Review of Systems:    UNABLE TO ASSESS DUE TO CRITICAL ILLNESS INTUBATED AND SEDATED  Past Medical History  She,  has a past medical history of Arthritis, Diabetes mellitus without complication ([place]), DVT (deep venous thrombosis) ([place]), Hyperlipidemia, and Ulcer of foot ([place]).   Surgical History    Past Surgical History:  Procedure Laterality Date  . ABDOMINAL HYSTERECTOMY  1967  . BLADDER SURGERY  1960s  . BREAST BIOPSY Left    abcess drained under arm  . broken ankle  1985  . broken arm Left 2015  . CATARACT EXTRACTION  [id number]  . PARTIAL HIP ARTHROPLASTY  2014  . URETERAL EXPLORATION  1960s   removed stricture/scar tissue     Social History   reports that she has never smoked. She has never used smokeless tobacco. She reports that she does not drink alcohol and does not use drugs.   Family History   Her family history includes Bladder Cancer in her son; Diabetes in her son. There is  no history of Breast cancer.   Allergies Allergies  Allergen Reactions  . Atorvastatin     Other reaction(s): Unknown  . Codeine     Stomach problems  . Ezetimibe     Other reaction(s): Unknown  . Statins     Other reaction(s): Unknown     Home Medications  Prior to Admission medications   Medication Sig Start Date End Date Taking? Authorizing Provider  CALCIUM CITRATE-VITAMIN D3 PO Take 1 tablet by mouth daily at 6 (six) AM. 06/14/14   [provider]  lisinopril (PRINIVIL,ZESTRIL) 20 MG tablet Take 1 tablet by mouth daily. 05/28/14   [provider]  vitamin B-12 (CYANOCOBALAMIN) 100 MCG tablet Take 100 mcg by mouth daily.    [provider]  warfarin (COUMADIN) 3 MG tablet Take 1 tablet by mouth 4 (four) times a week. 05/28/14   [provider]  warfarin (COUMADIN) 4 MG  tablet Take 4 mg by mouth 3 (three) times a week.    [provider]    Scheduled Meds: . fentaNYL (SUBLIMAZE) injection  25 mcg Intravenous Once   Continuous Infusions: . sodium chloride 10 mL/hr at 04/04/21 1200  . [START ON 04/05/2021] azithromycin    . ceFEPime (MAXIPIME) IV    . famotidine (PEPCID) IV 20 mg (04/04/21 1201)  . fentaNYL infusion INTRAVENOUS 75 mcg/hr (04/04/21 1200)  . norepinephrine (LEVOPHED) Adult infusion 4 mcg/min (04/04/21 1200)  . propofol (DIPRIVAN) infusion 15 mcg/kg/min (04/04/21 1200)  . [START ON 04/05/2021] vancomycin    . vancomycin     PRN Meds:.docusate sodium, fentaNYL, ipratropium-albuterol, polyethylene glycol    Assessment & Plan:   Acute Hypoxic Respiratory Failure secondary to Pneumonia, Aspiration, possible seizures in a patient with recent hx of seizures  - Ventilator settings: PRVC 8 mL/kg, 100 FiO2, 10 PEEP, continue ventilator support & lung protective strategies - Wean PEEP & FiO2 as tolerated, maintain SpO2 > 90% - Head of bed elevated 30 degrees, VAP protocol in place - Plateau pressures less than 30 cm H20  - Intermittent chest x-ray & ABG PRN - Daily WUA with SBT as tolerated  - Ensure adequate pulmonary hygiene  - nebs BID, bronchodilators PRN - PAD protocol in place: continue Fentanyl & Propofol   Sepsis with septic shock due to suspected Pneumonia and Urinary Tract Infection Lactic: 1.9>3.7, Baseline PCT: pending PMHx: Patient had 1/2 positive blood cultures growing staph epi and staph hominis at [facility] on 2/22, likely contaminant while admitted at [facility].  Repeat blood cultures from 2/23 negative thought to be mixed urogenital flora.  Patient was treated with CFTX 1 g x 5 days (2/25-3/1)  - F/u blood cultures, Urine Cx,  trend PCT - Monitor WBC/ fever curve - IV antibiotics: cefepime & vancomycin  - IVF hydration as needed:  - vasopressors to maintain MAP> 65, off Levo - Strict I/O's:goal UOP > 0.5 mL/kg/hr - if  persistent hypotension will  consider stress dose steroids (hydrocortisone 50q6 or 100q8)   History of seizures -high risk for seizure activity in the setting of sepsis continuous EEG at [facility] on 2/22, which showed focal discharges, LPDs over left temporal region, generalized rhythmic delta activity, slowing in left and right head regions, and rare GPDs with triphasic morphology. - Obtain EEG - Continue maintenance with Keppra 500 mg twice daily - We will treat underlying infectious process as above - Seizure precaution  Paroxysmal atrial fibrillation - Rate controlled - Hold metoprolol in the setting of hypertension resume once  resolved for rate control - Continue Eliquis 5 mg BID via OGT - Keep Mg>2 and K>4 - I's and O's  Diabetes mellitus - CBGs - Sliding scale insulin - Follow ICU hyper/hypoglycemia protocol  History of multiple embolic infarcts Visualized on MRI brain 11/30/2020 which showed multiple infarcts in the bilateral frontal/parietal/occipital lobes and left temporal lobe - Continue home Eliquis 5 mg twice daily - Continue Atorvastatin  - Recommend follow-up MRI brain, however daughter would like to defer    Best practice:  Diet:  Tube Feed  Pain/Anxiety/Delirium protocol (if indicated): Yes (RASS goal -1) VAP protocol (if indicated): Yes DVT prophylaxis: Systemic AC GI prophylaxis: H2B Glucose control:  SSI Yes Central venous access:  N/A Arterial line:  N/A Foley:  Yes, and it is still needed Mobility:  bed rest  PT consulted: N/A Last date of multidisciplinary goals of care discussion [4/22] Code Status:  full code Disposition: ICU   Rufina Falco, DNP, CCRN, FNP-C, AGACNP-BC Acute Care Nurse Practitioner  [facility] Pager: [phone] [place] at [facility]  . PCCM ATTENDING ATTESTATION:  Patient seen and examined and relevant ancillary tests reviewed.   I agree with the assessment and plan of care as outlined by  Rufina Falco NP.  This patient was not seen as a shared visit. The following reflects my independent critical care time.  I  personally  reviewed database in its entirety and discussed care plan in detail. In addition, this patient was discussed on multidisciplinary rounds.   I agree with assessment and plan.  Severe ACUTE Hypoxic and Hypercapnic Respiratory Failure due to septic shock and pneumonia and UTI -continue Mechanical Ventilator support -continue Bronchodilator Therapy -Wean Fio2 and PEEP as tolerated -VAP/VENT bundle implementation -will perform SAT/SBT when respiratory parameters are met  Septic shock -use vasopressors to keep MAP>65 -follow ABG and LA -follow up cultures -emperic ABX -consider stress dose steroids -aggressive IV fluid Resuscitation   NEUROLOGY ACUTE TOXIC METABOLIC ENCEPHALOPATHY -need for sedation -Goal RASS -2 to -3  ELECTROLYTES -follow labs as needed -replace as needed -pharmacy consultation and following   DVT/GI PRX ordered and assessed TRANSFUSIONS AS NEEDED MONITOR FSBS I Assessed the need for Labs I Assessed the need for Foley I Assessed the need for Central Venous Line Family Discussion when available I Assessed the need for Mobilization I made an Assessment of medications to be adjusted accordingly Safety Risk assessment completed  CASE DISCUSSED IN MULTIDISCIPLINARY ROUNDS WITH ICU TEAM       Critical Care Time devoted to patient care services described in this note is 65 minutes.   Overall, patient is critically ill, prognosis is guarded.  Patient with Multiorgan failure and at high risk for cardiac arrest and death.    Corrin Parker, M.D.  [facility]  Medical Director [place] Director [facility] Cardio-Pulmonary Department

## 2021-04-04 NOTE — ED Triage Notes (Signed)
Pt presents to the ED from [facility] via EMS with a cc of shortness of breath. Patient was found with sats in the 60s upon MEDIC arrival. 65% on 15L. Patient not responsive. Pt was seen here yesterday evening for an unwitnessed fall then discharged back to facility.

## 2021-04-04 NOTE — Plan of Care (Signed)
  Problem: Fluid Volume: °Goal: Hemodynamic stability will improve °Outcome: Not Progressing °  °Problem: Clinical Measurements: °Goal: Diagnostic test results will improve °Outcome: Not Progressing °Goal: Signs and symptoms of infection will decrease °Outcome: Not Progressing °  °Problem: Respiratory: °Goal: Ability to maintain adequate ventilation will improve °Outcome: Not Progressing °  °

## 2021-04-04 NOTE — ED Notes (Signed)
Patient being bagged by RT. Preparing for intubation.

## 2021-04-04 NOTE — Progress Notes (Signed)
Daughter called, consent for Central line placement is given. BP slowly dropping. Bolus is infusing.Levo at 5 at this time By RT ETT 60/5/18/400 after ABG

## 2021-04-04 NOTE — Plan of Care (Signed)
Pt arrived on the unit. ETT 100/10/20/400, OG to LWS, Temp foley. Pressure injury, MASD , bruisings are documented, present on admission. Does not follow commands. BP fluctuates. Levo on board, as well as Propofol and fentanyl. NS and LR boluses are given. UO is very cloudy with sediment.  LA critical value of 5.2 reported to NP and MD. Aware. Bed in low position, alarms are on, room near nursing station, mittens are on, Bear hugger applied for tem of 95, continue to monitor. Family (daughter Malachy Mood) called and updated.

## 2021-04-04 NOTE — ED Provider Notes (Signed)
.  Critical Care Performed by: Vanessa [place]de, MD Authorized by: Vanessa Stevinson, MD   Critical care provider statement:    Critical care time (minutes):  45   Critical care was necessary to treat or prevent imminent or life-threatening deterioration of the following conditions:  Sepsis and respiratory failure   Critical care was time spent personally by me on the following activities:  Discussions with consultants, evaluation of patient's response to treatment, examination of patient, ordering and performing treatments and interventions, ordering and review of laboratory studies, ordering and review of radiographic studies, pulse oximetry, re-evaluation of patient's condition, obtaining history from patient or surrogate and review of old charts .1-3 Lead EKG Interpretation Performed by: Vanessa Powell, MD Authorized by: Vanessa [place]way, MD     Interpretation: abnormal     ECG rate:  100s    ECG rate assessment: bradycardic     Rhythm: sinus tachycardia     Ectopy: none     Conduction: normal      Patient handed off to me with concerns for fluid overload and hypoxia.  Patient was given Lasix to diurese her due to frothy sputum.  Plan for labs and CT and admit to ICU.   7:44 AM I did alert  ICU team that there would be an ICU player pending CT imaging.  Dr. Mortimer Fries is aware and will see patient. Chest x-ray does show ET tube in place.  Labs so far are actually pretty reassuring. Added covid swab.   7:46 AM reevaluated patient and she is currently intubated with sats at 97%.  Patient on low-dose Levophed with good blood pressure.  Nurses are working on getting a larger IV for CT scans.  Family has not been by the emergency room so we will attempt to call them to update them. Attempted to call the daughter but no answer.   9:45 AM CT scan consistent with pneumonia.  Given patient's elevated white count and low blood pressures patient meets requirement for septic shock.  Given this new information and  the fact that I have lower suspicion this is secondary to fluid overload given no edema on xray and BNP normal I have given patient her full fluid resuscitation of 30 cc/kg and hope that patient can come off the small amount of Levophed and I have broadened out her antibiotics to include vent, cefepime, as the azithro  I discussed with the ICU team who will admit the patient.        Vanessa [place], MD 04/04/21 [phone]

## 2021-04-04 NOTE — ED Notes (Signed)
Lab at bedside

## 2021-04-04 NOTE — ED Notes (Addendum)
75 mg of Ketamine followed by 75mg  Rocuronium administered at this time.

## 2021-04-04 NOTE — Progress Notes (Signed)
eeg done °

## 2021-04-04 NOTE — ED Notes (Addendum)
ED Provider at bedside. Rt at bedside. Pt presents sats 65% on bipap. Unarousable. Preparing for intubation.

## 2021-04-04 NOTE — Progress Notes (Signed)
elink monitoring for sepsis protocol 

## 2021-04-04 NOTE — Progress Notes (Signed)
Pt transported to and from CT w/RN without incident

## 2021-04-04 NOTE — ED Notes (Signed)
Propofol paused due to patient hypotension.

## 2021-04-04 NOTE — ED Notes (Signed)
Lab called to collect blood work.

## 2021-04-04 NOTE — Consult Note (Signed)
CODE SEPSIS - PHARMACY COMMUNICATION  **Broad Spectrum Antibiotics should be administered within 1 hour of Sepsis diagnosis**  Time Code Sepsis Called/Page Received: 5329  Antibiotics Ordered: 9242  Time of 1st antibiotic administration: 0951  Additional action taken by pharmacy: N/A  If necessary, Name of Provider/Nurse Contacted: N/A    Darnelle Bos ,PharmD Clinical Pharmacist  04/04/2021  9:51 AM

## 2021-04-04 NOTE — Consult Note (Signed)
PHARMACY -  BRIEF ANTIBIOTIC NOTE   Pharmacy has received consult(s) for vancomycin and cefepime from an ED provider.  The patient's profile has been reviewed for ht/wt/allergies/indication/available labs.    One time order(s) placed for cefepime 2 g and vancomycin 1750 mg IV   Further antibiotics/pharmacy consults should be ordered by admitting physician if indicated.                       Thank you, Darnelle Bos, PharmD 04/04/2021  9:58 AM

## 2021-04-04 NOTE — Progress Notes (Signed)
Pharmacy Antibiotic Note  Molly Hawkins is a 85 y.o. female admitted on 04/04/2021 with pneumonia. CT reveals right lower lobe opacity, favoring pneumonia. Pharmacy has been consulted for vancomycin and cefepime dosing. Her renal function is at what appears to be her baseline level on admission  Plan:  1) start cefepime 2 grams IV every 12 hours  2) Vancomycin 1250 mg IV Q 24 hrs following a 1500 mg loading dose  Goal AUC 400-550  Expected AUC: 532.7  SCr used: 0.80 (rounded up)  Ke: 0.045 h-1, T1/2: 15.4 h  Css (calculated): 36.3 / 13.2 mcg/mL  Daily renal function assessment while on IV vancomycin  MRSA PCR ordered to narrow therapy if appropriate    Temp (24hrs), Avg:97.4 F (36.3 C), Min:96.8 F (36 C), Max:97.9 F (36.6 C)  Recent Labs  Lab 04/03/21 2038 04/04/21 0639  WBC 11.1* 13.8*  CREATININE 0.72 0.74  LATICACIDVEN  --  1.9    Estimated Creatinine Clearance: 54.1 mL/min (by C-G formula based on SCr of 0.74 mg/dL).    Allergies  Allergen Reactions  . Atorvastatin     Other reaction(s): Unknown  . Codeine     Stomach problems  . Ezetimibe     Other reaction(s): Unknown  . Statins     Other reaction(s): Unknown    Antimicrobials this admission: vancomycin 4/22 >>  azithromycin 4/22 >> cefepime 4/22 >>   Microbiology results: 4/22 BCx: pending 4/22 UCx: pending  4/22 MRSA PCR: pending 4/22 SARS CoV-2: negative 4/22 influenza A/B: negative  Thank you for allowing pharmacy to be a part of this patient's care.  Dallie Piles 04/04/2021 11:02 AM

## 2021-04-04 NOTE — Procedures (Signed)
Patient Name: Molly Hawkins  MRN: [id number]  Epilepsy Attending: Lora Havens  Referring Physician/Provider: Rufina Falco, NP Date: 04/04/2021 Duration: 30.23 mins  Patient history: 85yo F with h/o seizures with possible seizure. EEG to evaluate for seizure  Level of alertness: comatose  AEDs during EEG study: LEV, propofol, versed  Technical aspects: This EEG study was done with scalp electrodes positioned according to the 10-20 International system of electrode placement. Electrical activity was acquired at a sampling rate of 500Hz  and reviewed with a high frequency filter of 70Hz  and a low frequency filter of 1Hz . EEG data were recorded continuously and digitally stored.   Description: EEG showed continuous generalized polymorphic 3 to 6 Hz theta-delta slowing. Hyperventilation and photic stimulation were not performed.     ABNORMALITY - Continuous slow, generalized  IMPRESSION: This study is suggestive of severe diffuse encephalopathy, nonspecific etiology. No seizures or epileptiform discharges were seen throughout the recording.  Devanshi Califf Barbra Sarks

## 2021-04-04 NOTE — ED Notes (Signed)
Pt back from CT

## 2021-04-04 NOTE — ED Notes (Signed)
Stat x-ray called to bedside.

## 2021-04-04 NOTE — ED Provider Notes (Signed)
[facility] Emergency Department Provider Note   ____________________________________________   Event Date/Time   First MD Initiated Contact with Patient 04/04/21 [phone]     (approximate)  I have reviewed the triage vital signs and the nursing notes.   HISTORY  Chief Complaint Shortness of Breath    HPI Molly Hawkins is a 85 y.o. female presents via EMS after being found unresponsive and with oxygen saturation in the 60s.  Patient arrives with facemask in place and GCS of 6.  Further history and review of systems unable to assess secondary to mental status         Past Medical History:  Diagnosis Date  . Arthritis   . Diabetes mellitus without complication ([place])   . DVT (deep venous thrombosis) ([place])   . Hyperlipidemia   . Ulcer of foot [facility])     Patient Active Problem List   Diagnosis Date Noted  . Hay fever 05/24/2015  . Essential (primary) hypertension 05/24/2015  . Hypercholesteremia 05/24/2015  . B12 deficiency 05/24/2015  . Basal cell carcinoma of face 10/04/2014  . Broken humerus 04/03/2014  . Closed fracture of neck of right femur ([place]) 10/03/2013  . Blood glucose elevated 05/31/2013  . Carotid artery plaque 04/25/2013  . Vascular disorder of lower extremity 03/22/2012  . Long term current use of anticoagulant 02/22/2012  . Avitaminosis D 02/22/2012    Past Surgical History:  Procedure Laterality Date  . ABDOMINAL HYSTERECTOMY  1967  . BLADDER SURGERY  1960s  . BREAST BIOPSY Left    abcess drained under arm  . broken ankle  1985  . broken arm Left 2015  . CATARACT EXTRACTION  [id number]  . PARTIAL HIP ARTHROPLASTY  2014  . URETERAL EXPLORATION  1960s   removed stricture/scar tissue    Prior to Admission medications   Medication Sig Start Date End Date Taking? Authorizing Provider  CALCIUM CITRATE-VITAMIN D3 PO Take 1 tablet by mouth daily at 6 (six) AM. 06/14/14   [provider]  lisinopril (PRINIVIL,ZESTRIL)  20 MG tablet Take 1 tablet by mouth daily. 05/28/14   [provider]  vitamin B-12 (CYANOCOBALAMIN) 100 MCG tablet Take 100 mcg by mouth daily.    [provider]  warfarin (COUMADIN) 3 MG tablet Take 1 tablet by mouth 4 (four) times a week. 05/28/14   [provider]  warfarin (COUMADIN) 4 MG tablet Take 4 mg by mouth 3 (three) times a week.    [provider]    Allergies Atorvastatin, Codeine, Ezetimibe, and Statins  Family History  Problem Relation Age of Onset  . Bladder Cancer Son   . Diabetes Son   . Breast cancer Neg Hx     Social History Social History   Tobacco Use  . Smoking status: Never Smoker  . Smokeless tobacco: Never Used  Substance Use Topics  . Alcohol use: No  . Drug use: No    Review of Systems Unable to assess ____________________________________________   PHYSICAL EXAM:  VITAL SIGNS: ED Triage Vitals  Enc Vitals Group     BP 04/04/21 0624 138/63     Pulse Rate 04/04/21 0624 (!) 117     Resp 04/04/21 0653 19     Temp 04/04/21 0653 (!) 97 F (36.1 C)     Temp Source 04/04/21 0653 Oral     SpO2 04/04/21 0624 (!) 65 %     Weight --      Height --  Head Circumference --      Peak Flow --      Pain Score --      Pain Loc --      Pain Edu? --      Excl. in [place]? --    Constitutional: Somnolent with head bowed on stretcher and facemask in place minimally responsive Eyes: Conjunctivae are injected. PERRL. Head: Atraumatic. Nose: No congestion/rhinnorhea. Mouth/Throat: Mucous membranes are moist. Neck: No stridor Cardiovascular: Grossly normal heart sounds.  Good peripheral circulation. Respiratory: Increased respiratory effort but shallow respirations.  Rhonchi over bilateral lung fields and white frothy sputum in posterior oropharynx Gastrointestinal: Soft. No distention. Genitourinary: Normal external female genitalia Musculoskeletal: No obvious deformities Neurologic: GCS 6 Skin:  Skin is warm and  dry. No rash noted.  ____________________________________________   LABS (all labs ordered are listed, but only abnormal results are displayed)  Labs Reviewed  BLOOD GAS, VENOUS - Abnormal; Notable for the following components:      Result Value   pO2, Ven 55.0 (*)    Bicarbonate 31.5 (*)    Acid-Base Excess 4.2 (*)    All other components within normal limits  CULTURE, BLOOD (ROUTINE X 2)  CULTURE, BLOOD (ROUTINE X 2)  COMPREHENSIVE METABOLIC PANEL  BRAIN NATRIURETIC PEPTIDE  LACTIC ACID, PLASMA  LACTIC ACID, PLASMA  CBC WITH DIFFERENTIAL/PLATELET  PROTIME-INR  URINALYSIS, COMPLETE (UACMP) WITH MICROSCOPIC  TYPE AND SCREEN  TROPONIN I (HIGH SENSITIVITY)   ____________________________________________  EKG  ED ECG REPORT I, Naaman Plummer, the attending physician, personally viewed and interpreted this ECG.  Date: 04/04/2021 EKG Time: 0702 Rate: 113 Rhythm: Tachycardic sinus rhythm QRS Axis: normal Intervals: normal ST/T Wave abnormalities: normal Narrative Interpretation: no evidence of acute ischemia  ____________________________________________  RADIOLOGY  ED MD interpretation: Single view portable x-ray of the chest shows ET tube in adequate positioning and OG tube below the level of the diaphragm.  The right hemidiaphragm is slightly elevated compared to the left with flattening of the left hemidiaphragm as well as pulmonary edema appreciated to the right lung fields especially  Official radiology report(s): DG Ribs Unilateral W/Chest Right  Result Date: 04/03/2021 CLINICAL DATA:  Unwitnessed fall. EXAM: RIGHT RIBS AND CHEST - 3+ VIEW COMPARISON:  Chest x-ray 04/15/2016 FINDINGS: The cardiac silhouette, mediastinal and hilar contours are within normal limits given the AP projection and supine position of the patient. There is tortuosity and calcification of the thoracic aorta. Significant overlying artifact but no obvious pulmonary infiltrates, pleural effusions  or pneumothorax. IMPRESSION: Significant overlying artifact but no obvious pulmonary infiltrates or pleural effusions. Electronically Signed   By: Marijo Sanes M.D.   On: 04/03/2021 21:06   CT Head Wo Contrast  Result Date: 04/03/2021 CLINICAL DATA:  Unwitnessed fall EXAM: CT HEAD WITHOUT CONTRAST TECHNIQUE: Contiguous axial images were obtained from the base of the skull through the vertex without intravenous contrast. COMPARISON:  04/15/2016 FINDINGS: Brain: There is a posterior left parietal infarct, age indeterminate but new since 2017. Chronic small vessel disease throughout the deep white matter. No hemorrhage or hydrocephalus. Vascular: No hyperdense vessel or unexpected calcification. Skull: No acute calvarial abnormality. Sinuses/Orbits: No acute findings Other: None IMPRESSION: Age-indeterminate posterior left parietal infarct. Chronic small vessel disease. Electronically Signed   By: Rolm Baptise M.D.   On: 04/03/2021 21:22   CT Cervical Spine Wo Contrast  Result Date: 04/03/2021 CLINICAL DATA:  85 year old female with fall. EXAM: CT CERVICAL SPINE WITHOUT CONTRAST TECHNIQUE: Multidetector CT imaging of the cervical  spine was performed without intravenous contrast. Multiplanar CT image reconstructions were also generated. COMPARISON:  None. FINDINGS: Alignment: No acute subluxation. Skull base and vertebrae: No acute fracture. Soft tissues and spinal canal: No prevertebral fluid or swelling. No visible canal hematoma. Disc levels:  Multilevel degenerative changes. Upper chest: Negative. Other: Bilateral carotid bulb calcified plaques. IMPRESSION: No acute/traumatic cervical spine pathology. Electronically Signed   By: Anner Crete M.D.   On: 04/03/2021 21:21   DG Chest Port 1 View  Result Date: 04/04/2021 CLINICAL DATA:  Encounter for intubation EXAM: PORTABLE CHEST 1 VIEW COMPARISON:  Yesterday FINDINGS: New endotracheal tube with tip just below the clavicular heads. The enteric tube tip  and side-port reaches the stomach. New infiltrate at the right lung base with elevated diaphragm. No edema, effusion, or pneumothorax. Normal heart size. IMPRESSION: 1. New hardware in unremarkable position. 2. New atelectasis at the right base. Aspiration or pneumonia may be coexistent. Electronically Signed   By: Monte Fantasia M.D.   On: 04/04/2021 07:13    ____________________________________________   PROCEDURES  Procedure(s) performed (including Critical Care):  .1-3 Lead EKG Interpretation Performed by: Naaman Plummer, MD Authorized by: Naaman Plummer, MD     Interpretation: abnormal     ECG rate:  114   ECG rate assessment: tachycardic     Rhythm: sinus tachycardia     Ectopy: none     Conduction: normal   Procedure Name: Intubation Date/Time: 04/04/2021 7:17 AM Performed by: Naaman Plummer, MD Pre-anesthesia Checklist: Patient identified, Patient being monitored, Emergency Drugs available, Timeout performed and Suction available Oxygen Delivery Method: Non-rebreather mask Preoxygenation: Pre-oxygenation with 100% oxygen Induction Type: Rapid sequence Ventilation: Mask ventilation without difficulty Laryngoscope Size: Glidescope Grade View: Grade I Tube size: 7.0 mm Number of attempts: 3 Airway Equipment and Method: Video-laryngoscopy and Bougie stylet Placement Confirmation: ETT inserted through vocal cords under direct vision,  CO2 detector and Breath sounds checked- equal and bilateral Secured at: 23 cm Tube secured with: ETT holder Difficulty Due To: Difficulty was anticipated        ____________________________________________   INITIAL IMPRESSION / ASSESSMENT AND PLAN / ED COURSE  As part of my medical decision making, I reviewed the following data within the [place] notes reviewed and incorporated, Labs reviewed, EKG interpreted, Old chart reviewed, Radiograph reviewed and Notes from prior ED visits reviewed and  incorporated        85 year old female presents in respiratory distress and altered mental status a GCS of 6.  Patient intubated upon arrival due to her respiratory status and GCS.  Patient is initially given 40 mg of Lasix for presumed flash pulmonary edema.  Laboratory and radiologic evaluation pending  Care of this patient will be signed out to the oncoming physician at the end of my shift.  All pertinent patient information conveyed and all questions answered.  All further care and disposition decisions will be made by the oncoming physician.      ____________________________________________   FINAL CLINICAL IMPRESSION(S) / ED DIAGNOSES  Final diagnoses:  Hypoxia  Acute respiratory distress     ED Discharge Orders    None       Note:  This document was prepared using Dragon voice recognition software and may include unintentional dictation errors.   Naaman Plummer, MD 04/04/21 [phone]

## 2021-04-04 NOTE — Progress Notes (Signed)
Initial Nutrition Assessment  DOCUMENTATION CODES:   Severe malnutrition in context of social or environmental circumstances  INTERVENTION:   If tube feeds initiated, recommend:  Vital 1.2 @45ml /hr + ProSource 60ml daily via tube   Free water flushes 44ml q4 hours to maintain tube patency   Regimen provides 1336kcal/day, 92g/day protein and 1029ml/day free water   Liquid MVI daily via tube   Pt at high refeed risk; recommend monitor potassium, magnesium and phosphorus labs daily until stable  NUTRITION DIAGNOSIS:   Severe Malnutrition related to social / environmental circumstances (advanced age) as evidenced by severe fat depletion,severe muscle depletion.  GOAL:   Provide needs based on ASPEN/SCCM guidelines  MONITOR:   Vent status,Labs,Weight trends,Skin,I & O's  REASON FOR ASSESSMENT:   Malnutrition Screening Tool    ASSESSMENT:   85 y.o. 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 OGT in place. No plans for tube feeds today as pt is not stable.   Per chart, pt is down 35lbs(19%) since July 2021; this is significant weight loss if her admit weight is correct.   Medications reviewed and include: azithromycin, cefepime, pepcid, fentanyl, levophed, vancomycin   Labs reviewed: wbc- 13.8(H)  Patient is currently intubated on ventilator support MV: 9.0 L/min Temp (24hrs), Avg:97.2 F (36.2 C), Min:96.1 F (35.6 C), Max:97.9 F (36.6 C)  Propofol: 4.87ml/hr- provides 116kcal/day   MAP- 57-62mmHg   UOP- 758ml  NUTRITION - FOCUSED PHYSICAL EXAM:  Flowsheet Row Most Recent Value  Orbital Region Severe depletion  Upper Arm Region Severe depletion  Thoracic and Lumbar Region Severe  depletion  Buccal Region Severe depletion  Temple Region Severe depletion  Clavicle Bone Region Moderate depletion  Clavicle and Acromion Bone Region Moderate depletion  Scapular Bone Region Moderate depletion  Dorsal Hand Severe depletion  Patellar Region Severe depletion  Anterior Thigh Region Severe depletion  Posterior Calf Region Severe depletion  Edema (RD Assessment) None  Hair Reviewed  Eyes Reviewed  Mouth Reviewed  Skin Reviewed  Nails Reviewed     Diet Order:   Diet Order            Diet NPO time specified  Diet effective now                EDUCATION NEEDS:   No education needs have been identified at this time  Skin:  Skin Assessment: Reviewed RN Assessment (MASD, Stage II sacrum)  Last BM:  pta  Height:   Ht Readings from Last 1 Encounters:  04/04/21 5\' 5"  (1.651 m)    Weight:   Wt Readings from Last 1 Encounters:  04/04/21 67.7 kg    Ideal Body Weight:  56.8 kg  BMI:  Body mass index is 24.84 kg/m.  Estimated Nutritional Needs:   Kcal:  1277kcal/day  Protein:  85-100g/day  Fluid:  1.4-1.7L/day  Koleen Distance MS, RD, LDN Please refer to [facility] for RD and/or RD on-call/weekend/after hours pager

## 2021-04-05 ENCOUNTER — Inpatient Hospital Stay: Payer: Medicare Other

## 2021-04-05 ENCOUNTER — Other Ambulatory Visit: Payer: Self-pay

## 2021-04-05 DIAGNOSIS — J9601 Acute respiratory failure with hypoxia: Secondary | ICD-10-CM | POA: Diagnosis not present

## 2021-04-05 DIAGNOSIS — R0603 Acute respiratory distress: Secondary | ICD-10-CM | POA: Diagnosis not present

## 2021-04-05 DIAGNOSIS — E43 Unspecified severe protein-calorie malnutrition: Secondary | ICD-10-CM | POA: Diagnosis not present

## 2021-04-05 LAB — URINE CULTURE

## 2021-04-05 LAB — CBC
HCT: 37.9 % (ref 36.0–46.0)
Hemoglobin: 11.6 g/dL — ABNORMAL LOW (ref 12.0–15.0)
MCH: 27.8 pg (ref 26.0–34.0)
MCHC: 30.6 g/dL (ref 30.0–36.0)
MCV: 90.9 fL (ref 80.0–100.0)
Platelets: 227 10*3/uL (ref 150–400)
RBC: 4.17 MIL/uL (ref 3.87–5.11)
RDW: 16.1 % — ABNORMAL HIGH (ref 11.5–15.5)
WBC: 24.8 10*3/uL — ABNORMAL HIGH (ref 4.0–10.5)
nRBC: 0 % (ref 0.0–0.2)

## 2021-04-05 LAB — GLUCOSE, CAPILLARY
Glucose-Capillary: 114 mg/dL — ABNORMAL HIGH (ref 70–99)
Glucose-Capillary: 72 mg/dL (ref 70–99)
Glucose-Capillary: 74 mg/dL (ref 70–99)
Glucose-Capillary: 76 mg/dL (ref 70–99)
Glucose-Capillary: 81 mg/dL (ref 70–99)
Glucose-Capillary: 97 mg/dL (ref 70–99)

## 2021-04-05 LAB — BASIC METABOLIC PANEL
Anion gap: 11 (ref 5–15)
BUN: 17 mg/dL (ref 8–23)
CO2: 25 mmol/L (ref 22–32)
Calcium: 8.2 mg/dL — ABNORMAL LOW (ref 8.9–10.3)
Chloride: 104 mmol/L (ref 98–111)
Creatinine, Ser: 0.77 mg/dL (ref 0.44–1.00)
GFR, Estimated: >60 mL/min (ref >60)
Glucose, Bld: 126 mg/dL — ABNORMAL HIGH (ref 70–99)
Potassium: 3.3 mmol/L — ABNORMAL LOW (ref 3.5–5.1)
Sodium: 140 mmol/L (ref 135–145)

## 2021-04-05 LAB — TRIGLYCERIDES: Triglycerides: 72 mg/dL (ref <150)

## 2021-04-05 LAB — PHOSPHORUS: Phosphorus: 2.7 mg/dL (ref 2.5–4.6)

## 2021-04-05 LAB — MAGNESIUM: Magnesium: 1.4 mg/dL — ABNORMAL LOW (ref 1.7–2.4)

## 2021-04-05 MED ORDER — POTASSIUM CHLORIDE 10 MEQ/100ML IV SOLN
10.0000 meq | INTRAVENOUS | Status: AC
  Administered 2021-04-05 (×3): 10 meq via INTRAVENOUS
  Filled 2021-04-05 (×3): qty 100

## 2021-04-05 MED ORDER — ENOXAPARIN SODIUM 80 MG/0.8ML ~~LOC~~ SOLN
1.0000 mg/kg | SUBCUTANEOUS | Status: DC
  Administered 2021-04-05: 70 mg via SUBCUTANEOUS
  Filled 2021-04-05: qty 0.8

## 2021-04-05 MED ORDER — METOPROLOL TARTRATE 25 MG PO TABS: 12.5000 mg | ORAL_TABLET | Freq: Two times a day (BID) | ORAL | Status: DC

## 2021-04-05 MED ORDER — LEVETIRACETAM 500 MG/5ML IV SOLN
750.0000 mg | Freq: Two times a day (BID) | INTRAVENOUS | Status: DC
  Administered 2021-04-05 – 2021-04-07 (×5): 750 mg via INTRAVENOUS
  Filled 2021-04-05: qty 7.5
  Filled 2021-04-05: qty 5
  Filled 2021-04-05 (×5): qty 7.5

## 2021-04-05 MED ORDER — MAGNESIUM SULFATE 4 GM/100ML IV SOLN
4.0000 g | Freq: Once | INTRAVENOUS | Status: AC
  Administered 2021-04-05: 4 g via INTRAVENOUS
  Filled 2021-04-05: qty 100

## 2021-04-05 MED ORDER — POTASSIUM CHLORIDE 20 MEQ PO PACK
20.0000 meq | PACK | ORAL | Status: AC
  Administered 2021-04-05 (×2): 20 meq
  Filled 2021-04-05 (×2): qty 1

## 2021-04-05 MED ORDER — LACTATED RINGERS IV SOLN
INTRAVENOUS | Status: AC
  Administered 2021-04-05: 100 mL/h via INTRAVENOUS

## 2021-04-05 MED ORDER — DOCUSATE SODIUM 50 MG/5ML PO LIQD
100.0000 mg | Freq: Two times a day (BID) | ORAL | Status: DC | PRN
  Filled 2021-04-05: qty 10

## 2021-04-05 MED ORDER — METOPROLOL TARTRATE 5 MG/5ML IV SOLN
5.0000 mg | Freq: Four times a day (QID) | INTRAVENOUS | Status: DC
  Administered 2021-04-06 – 2021-04-07 (×6): 5 mg via INTRAVENOUS
  Filled 2021-04-05 (×6): qty 5

## 2021-04-05 MED ORDER — POTASSIUM CHLORIDE 10 MEQ/100ML IV SOLN
10.0000 meq | INTRAVENOUS | Status: DC
Start: 2021-04-05 — End: 2021-04-05
  Administered 2021-04-05: 10 meq via INTRAVENOUS
  Filled 2021-04-05 (×4): qty 100

## 2021-04-05 NOTE — Progress Notes (Signed)
Patient back in Afib/Afib with RVR up to the 130s. Toribio Harbour, NP made aware and orders received. KUB done to confirm OGT placement, orders received that it is okay to use. Will continue to monitor.

## 2021-04-05 NOTE — Progress Notes (Signed)
Patient sedation previously held for unresponsiveness restarted at 18mcg per Toribio Harbour, NP. Patient RASS currently -4. Will Continue to monitor.

## 2021-04-05 NOTE — Progress Notes (Addendum)
Patient self extubated @ 1725.  Patient placed on 2lpm [place] and tolerating well at this time. Will continue to monitor.

## 2021-04-05 NOTE — Progress Notes (Addendum)
NAME:  MERIT GADSBY, MRN:  [id number], DOB:  Nov 27, 1936, LOS: 1 ADMISSION DATE:  04/04/2021, INITIAL CONSULTATION DATE:  REFERRING MD:  Marjean Donna MD, CHIEF COMPLAINT: Shortness of Breath  Brief Patient Description  85 y.o. female with PMH a. Fib, DVT, CVA (multiple embolic strokes 44/9675 with residual left arm weakness [facility]), HTN, HLD, carotid stenosis, osteoporosis who presented to the ED from [facility] admitted with acute hypoxic respiratory failure requiring intubation and sepsis with septic shock due to pneumonia and UTI  Pertinent  Medical History  Atrial fibrillation DVT CVA (multiple embolic strokes 91/6384) with residual left arm weakness Carotid stenosis Osteoporosis Complicated cystitis Diabetes mellitus Arthritis Hyperlipidemia Foot ulcers  Significant Hospital Events: Including procedures, antibiotic start and stop dates in addition to other pertinent events   4/22: Admitted to PCCM service  Procedures:  4/22: Intubation  Significant Diagnostic Tests:  4/22: Chest Xray> 4/22: Noncontrast CT head>no acute intracranial abnormality  4/22: CT cervical Spine> No evidence of acute cervical spine fracture, traumatic subluxation or static signs of instability 4/22: CTA abdomen and pelvis > no acute abnormality, chronic right pelvic fractures was noted 4/22: CTA Chest>no evidence of pulmonary embolism however a right lower lobe opacity concerning for pneumonia was noted  Micro Data:  4/22: SARS-CoV-2 PCR>> negativ 4/22: Influenza PCR> negative 4/22: Blood culture x2> pending 4/22: Urine Cx> pending 4/22: MRSA PCR> Positive 4/22: Strep pneumo urinary antigen > 4/22: Legionella urinary antigen> 4/22: Mycoplasma pneumonia> pending   Antimicrobials:  Cefepime 4/22>> Vancomycin 4/22>>  Interim History / Subjective:  Remains intubated currently not sedated. Failed SBT Goals of care discussed with the patient's daughter patient made a DNR  OBJECTIVE    Blood pressure (!) 90/47, pulse 70, temperature 98.4 F (36.9 C), temperature source Axillary, resp. rate 18, height 5' 5"  (1.651 m), weight 70.1 kg, SpO2 100 %.    Vent Mode: PRVC FiO2 (%):  [50 %-100 %] 50 % Set Rate:  [18 bmp-22 bmp] 18 bmp Vt Set:  [400 mL] 400 mL PEEP:  [5 cmH20-10 cmH20] 5 cmH20 Plateau Pressure:  [21 cmH20] 21 cmH20   Intake/Output Summary (Last 24 hours) at 04/05/2021 0804 Last data filed at 04/05/2021 0700 Gross per 24 hour  Intake 7232.79 ml  Output 1265 ml  Net 5967.79 ml   Filed Weights   04/04/21 1145 04/05/21 0330  Weight: 67.7 kg 70.1 kg   Physical Examination: GENERAL:85 year-old critically ill patient lying in the bed intubated and sedated. EYES: Pupils equal, round, reactive to light and accommodation. No scleral icterus. Extraocular muscles intact.  HEENT: Head atraumatic, normocephalic. Oropharynx and nasopharynx clear.  NECK:  Supple, no jugular venous distention. No thyroid enlargement, no tenderness.  LUNGS: Coarse breath sounds bilaterally, no wheezing, rales,rhonchi or crepitation. No use of accessory muscles of respiration.  CARDIOVASCULAR: S1, S2 normal. No murmurs, rubs, or gallops.  ABDOMEN: Soft, nontender, nondistended. Bowel sounds present. No organomegaly or mass.  EXTREMITIES: No pedal edema, cyanosis, or clubbing.  NEUROLOGIC:Unable to assess speech,  Unable to follow commands with sedation off. Right upward gaze. Does not blink to threat on the right. Right upper extremity weakness, does not break gravity.Bilateral lower extremity does not break gravity. Weakly withdraws to noxious stimuli.  Moves left arm without difficulty, strength normal. Localizes pain with left upper arm. Plantars down on the right, up on  the left. Decreased sensation on the right side of the body.  PSYCHIATRIC: Unable to assess. SKIN: No obvious rash, lesion, or ulcer.  Labs/imaging that I havepersonally reviewed  (right click and "Reselect all  SmartList Selections" daily)    Labs   CBC: Recent Labs  Lab 04/03/21 2038 04/04/21 0639 04/05/21 0429  WBC 11.1* 13.8* 24.8*  NEUTROABS 6.6 8.4*  --   HGB 13.8 13.7 11.6*  HCT 43.9 43.5 37.9  MCV 88.7 88.6 90.9  PLT 310 340 440    Basic Metabolic Panel: Recent Labs  Lab 04/03/21 2038 04/04/21 0639 04/05/21 0429  NA 143 143 140  K 3.8 3.7 3.3*  CL 99 101 104  CO2 30 28 25   GLUCOSE 124* 148* 126*  BUN 14 16 17   CREATININE 0.72 0.74 0.77  CALCIUM 9.2 9.4 8.2*  MG  --   --  1.4*  PHOS  --   --  2.7   GFR: Estimated Creatinine Clearance: 53.2 mL/min (by C-G formula based on SCr of 0.77 mg/dL). Recent Labs  Lab 04/03/21 2038 04/04/21 0639 04/04/21 1043 04/04/21 1214 04/04/21 1216 04/04/21 1534 04/04/21 1840 04/04/21 2139 04/05/21 0429  PROCALCITON  --   --   --  <0.10  --   --   --   --   --   WBC 11.1* 13.8*  --   --   --   --   --   --  24.8*  LATICACIDVEN  --  1.9   < >  --  5.2* 3.9* 2.2* 1.9  --    < > = values in this interval not displayed.    Liver Function Tests: Recent Labs  Lab 04/04/21 0639  AST 20  ALT 11  ALKPHOS 101  BILITOT 0.9  PROT 7.3  ALBUMIN 3.0*   No results for input(s): LIPASE, AMYLASE in the last 168 hours. No results for input(s): AMMONIA in the last 168 hours.  ABG    Component Value Date/Time   PHART 7.47 (H) 04/05/2021 0453   PCO2ART 36 04/05/2021 0453   PO2ART 148 (H) 04/05/2021 0453   HCO3 26.2 04/05/2021 0453   O2SAT 99.4 04/05/2021 0453     Coagulation Profile: Recent Labs  Lab 04/04/21 0639  INR 1.2    Cardiac Enzymes: Recent Labs  Lab 04/04/21 0630  CKTOTAL 30*    HbA1C: No results found for: HGBA1C  CBG: Recent Labs  Lab 04/04/21 1140 04/04/21 2038 04/04/21 2310 04/05/21 0310 04/05/21 0737  GLUCAP 157* 159* 153* 114* 97    Allergies Allergies  Allergen Reactions  . Atorvastatin     Other reaction(s): Unknown  . Codeine     Stomach problems  . Ezetimibe     Other  reaction(s): Unknown  . Statins     Other reaction(s): Unknown     Home Medications  Prior to Admission medications   Medication Sig Start Date End Date Taking? Authorizing Provider  acetaminophen (TYLENOL) 325 MG tablet Take 650 mg by mouth every 6 (six) hours as needed.   Yes [provider]  acetaminophen (TYLENOL) 650 MG suppository Place 650 mg rectally every 6 (six) hours as needed for fever.   Yes [provider]  apixaban (ELIQUIS) 5 MG TABS tablet Take 5 mg by mouth 2 (two) times daily.   Yes [provider]  atorvastatin (LIPITOR) 40 MG tablet Take 40 mg by mouth at bedtime.   Yes [provider]  dextromethorphan-guaiFENesin (TUSSIN DM) 10-100 MG/5ML liquid Take 10 mLs by mouth every 6 (six) hours as needed for cough.   Yes [provider]  Ensure (ENSURE) Take  237 mLs by mouth daily with lunch.   Yes [provider]  levETIRAcetam (KEPPRA) 750 MG tablet Take 750 mg by mouth 2 (two) times daily.   Yes [provider]  metoprolol tartrate (LOPRESSOR) 25 MG tablet Take 12.5 mg by mouth 2 (two) times daily.   Yes [provider]  ondansetron (ZOFRAN-ODT) 4 MG disintegrating tablet Take 4 mg by mouth every 6 (six) hours as needed for nausea or vomiting.   Yes [provider]  QUEtiapine (SEROQUEL) 25 MG tablet Take 25 mg by mouth 2 (two) times daily.   Yes [provider]    No current facility-administered medications on file prior to encounter.   Current Outpatient Medications on File Prior to Encounter  Medication Sig Dispense Refill  . acetaminophen (TYLENOL) 325 MG tablet Take 650 mg by mouth every 6 (six) hours as needed.    Marland Kitchen acetaminophen (TYLENOL) 650 MG suppository Place 650 mg rectally every 6 (six) hours as needed for fever.    Marland Kitchen apixaban (ELIQUIS) 5 MG TABS tablet Take 5 mg by mouth 2 (two) times daily.    Marland Kitchen atorvastatin (LIPITOR) 40 MG tablet Take 40 mg by mouth at bedtime.    Marland Kitchen  dextromethorphan-guaiFENesin (TUSSIN DM) 10-100 MG/5ML liquid Take 10 mLs by mouth every 6 (six) hours as needed for cough.    . Ensure (ENSURE) Take 237 mLs by mouth daily with lunch.    . levETIRAcetam (KEPPRA) 750 MG tablet Take 750 mg by mouth 2 (two) times daily.    . metoprolol tartrate (LOPRESSOR) 25 MG tablet Take 12.5 mg by mouth 2 (two) times daily.    . ondansetron (ZOFRAN-ODT) 4 MG disintegrating tablet Take 4 mg by mouth every 6 (six) hours as needed for nausea or vomiting.    Marland Kitchen QUEtiapine (SEROQUEL) 25 MG tablet Take 25 mg by mouth 2 (two) times daily.      Resolved Hospital Problem list   none  ASSESSMENT & PLAN  Acute Hypoxic Respiratory Failure secondary to Pneumonia, Aspiration, possible seizures in a patient with recent hx of seizures  - Ventilator settings: PRVC 8 mL/kg, 100 FiO2, 10 PEEP, continue ventilator support & lung protective strategies - Wean PEEP & FiO2 as tolerated, maintain SpO2 > 90% - Head of bed elevated 30 degrees, VAP protocol in place - Plateau pressures less than 30 cm H20  - Intermittent chest x-ray & ABG PRN - Daily WUA with SBT as tolerated, failed SBT this morning - Ensure adequate pulmonary hygiene  - nebs BID, bronchodilators PRN - PAD protocol in place: continue Fentanyl & Propofol  Sepsis with septic shock due to suspected Pneumonia and Urinary Tract Infection Lactic: 1.9>3.7, Baseline PCT: pending PMHx: Patient had 1/2 positive blood cultures growing staph epi and staph hominis at [facility] on 2/22, likely contaminant while admitted at [facility].  Repeat blood cultures from 2/23 negative thought to be mixed urogenital flora.  Patient was treated with CFTX 1 g x 5 days (2/25-3/1)  - F/u blood cultures, Urine Cx,  trend PCT - Monitor WBC/ fever curve - IV antibiotics: cefepime & vancomycin  - IVF hydration as needed:  - vasopressors to maintain MAP> 65,  - Strict I/O's:goal UOP > 0.5 mL/kg/hr - if persistent hypotension will  consider stress  dose steroids (hydrocortisone 50q6 or 100q8)   History of seizures -high risk for seizure activity in the setting of sepsis continuous EEG at [facility] on 2/22, which showed focal discharges, LPDs over left temporal region,  generalized rhythmic delta activity, slowing in left and right head regions, and rare GPDs with triphasic morphology. - EEG done on 4/22 shows severe diffuse encephalopathy, no seizure activity noted - Continue maintenance with Keppra 500 mg twice daily - We will treat underlying infectious process as above - Seizure precaution  Paroxysmal atrial fibrillation - Rate controlled - Hold metoprolol in the setting of hypertension resume once resolved for rate control - Continue Eliquis 5 mg BID via OGT - Keep Mg>2 and K>4 - I's and O's  Diabetes mellitus - CBGs - Sliding scale insulin - Follow ICU hyper/hypoglycemia protocol  History of multiple embolic infarcts Visualized on MRI brain 11/30/2020 which showed multiple infarcts in the bilateral frontal/parietal/occipital lobes and left temporal lobe - Continue home Eliquis 5 mg twice daily - Continue Atorvastatin  - Recommend follow-up MRI brain, however daughter would like to defer  Best practice (right click and "Reselect all SmartList Selections" daily)   Diet: Tube Feed  Pain/Anxiety/Delirium protocol (if indicated): Yes (RASS goal -1) VAP protocol (if indicated): Yes DVT prophylaxis: Systemic AC GI prophylaxis: H2B Glucose control: SSI Yes Central venous access: N/A Arterial line: N/A Foley: Yes, and it is still needed Mobility: bed rest PT consulted: N/A Last date of multidisciplinary goals of care discussion[4/22] Code Status: full code Disposition:ICU     Rufina Falco, DNP, FNP-C, AGACNP-BC Acute Care Nurse Practitioner  [facility] Pager: [phone] [place] at [place]:  Patient seen and examined and relevant  ancillary tests reviewed.   I agree with the assessment and plan of care as outlined by Rufina Falco NP.  This patient was not seen as a shared visit. The following reflects my independent critical care time.  I  personally  reviewed database in its entirety and discussed care plan in detail. In addition, this patient was discussed on multidisciplinary rounds.   I agree with assessment and plan.  Severe ACUTE Hypoxic and Hypercapnic Respiratory Failure aspiration from probable acute CVA -continue Mechanical Ventilator support -continue Bronchodilator Therapy -Wean Fio2 and PEEP as tolerated -VAP/VENT bundle implementation -will perform SAT/SBT when respiratory parameters are met   NEUROLOGY ACUTE TOXIC METABOLIC ENCEPHALOPATHY Family has refused further diagnostic work up  ACUTE KIDNEY INJURY/Renal Failure -continue Foley Catheter-assess need -Avoid nephrotoxic agents -Follow urine output, BMP -Ensure adequate renal perfusion, optimize oxygenation -Renal dose medications  Septic shock -use vasopressors to keep MAP>65  ELECTROLYTES -follow labs as needed -replace as needed -pharmacy consultation and following   ENDO - ICU hypoglycemic\Hyperglycemia protocol -check FSBS per protocol   DVT/GI PRX ordered and assessed TRANSFUSIONS AS NEEDED MONITOR FSBS I Assessed the need for Labs I Assessed the need for Foley I Assessed the need for Central Venous Line Family Discussion when available I Assessed the need for Mobilization I made an Assessment of medications to be adjusted accordingly Safety Risk assessment completed   Critical Care Time devoted to patient care services described in this note is 65 minutes.   Overall, patient is critically ill, prognosis is guarded.  Patient with Multiorgan failure and at high risk for cardiac arrest and death.    Corrin Parker, M.D.  [facility]  Medical Director [place]  Director [facility] Cardio-Pulmonary Department

## 2021-04-05 NOTE — Progress Notes (Signed)
Patient self extubated at approximately 1725. RT and [place]uma, NP called to bedside. Thorough oral suction provided. Patient placed on 2 L [place]. Oxygen saturation of 100%. Patient able to verbalize her name and that she is in the hospital. BP/HR stable at this time. Will continue to monitor patients respiratory status closely.

## 2021-04-05 NOTE — Progress Notes (Addendum)
Goals of Care  The Clinical status was relayed to patient's daughter Molly Hawkins [phone]) in detail.  Updated and notified of patients guarded medical condition.  Patient remains unresponsive on the ventilator and will not open eyes to command.   Upon assessment: Patient is  unable to follow commands with sedation off. Right upward gaze. Does not blink to threat on the right. Right upper extremity weakness, does not break gravity. Bilateral lower extremity does not break gravity. Weakly withdraws to noxious stimuli.  Moves left arm without difficulty, strength normal. Localizes pain with left upper arm. Plantars down on the right, up on  the left. Decreased sensation on the right side of the body  A brief spontaneous breathing trial was performed and patient failed within minutes of initiation.  Reviewed EEG result which showed severe diffuse encephalopathy.  Explained to family course of therapy and the modalities    Patient with Progressive multiorgan failure with very low chance of meaningful recovery despite all aggressive and optimal medical therapy. Patient is in the Dying  Process associated with Suffering.  Patient's daughter states that, patient has been through a lot with multiple medical comorbidities requiring multiple hospitalization in the last year most recent at [facility] for multiple embolic strokes.  At this time she does not wish her mother to continue with suffering and poor quality of life.  Patient's only child Molly Hawkins have consented and agreed to DNR/DNI and would like to proceed with Comfort care measures if she does not show any signs of improvement on the ventilator by next week.  Family are satisfied with Plan of action and management. All questions answered  Additional CC time 32 mins   Molly Falco, DNP, FNP-C, AGACNP-BC Acute Care Nurse Practitioner  [place] [facility] Pager: [phone] [place] at [facility]

## 2021-04-05 NOTE — Progress Notes (Signed)
Patient urine output dropped to 36ml/hr. Levophed restarted at 99mcg for MAP of 60. Toribio Harbour, NP made aware and LR started as ordered. Will continue to monitor.

## 2021-04-05 NOTE — Progress Notes (Signed)
SHIFT SUMMARY  Patients daughter updated by E. Stark Klein, NP via phone call this morning. Patient is now DNR per daughters request.   Patients daughter & grand-daughter visited bedside briefly this afternoon.  Continuous Fentanyl stopped on this shift.  Patients VS remain stable, patient unable to follow commands at this time.   Patient remains off pressors and sedation.

## 2021-04-05 NOTE — Consult Note (Signed)
ANTICOAGULATION CONSULT NOTE - Initial Consult  Pharmacy Consult for enoxparin Indication: atrial fibrillation  Allergies  Allergen Reactions  . Atorvastatin     Other reaction(s): Unknown  . Codeine     Stomach problems  . Ezetimibe     Other reaction(s): Unknown  . Statins     Other reaction(s): Unknown    Patient Measurements: Height: 5\' 5"  (165.1 cm) Weight: 70.1 kg (154 lb 8.7 oz) IBW/kg (Calculated) : 57   Vital Signs: Temp: 97.7 F (36.5 C) (04/23 1600) Temp Source: Axillary (04/23 1600) BP: 146/56 (04/23 1830) Pulse Rate: 103 (04/23 1800)  Labs: Recent Labs    04/03/21 2038 04/04/21 0630 04/04/21 0639 04/04/21 1043 04/05/21 0429  HGB 13.8  --  13.7  --  11.6*  HCT 43.9  --  43.5  --  37.9  PLT 310  --  340  --  227  LABPROT  --   --  15.0  --   --   INR  --   --  1.2  --   --   CREATININE 0.72  --  0.74  --  0.77  CKTOTAL  --  30*  --   --   --   TROPONINIHS 8  --  15 8  --     Estimated Creatinine Clearance: 53.2 mL/min (by C-G formula based on SCr of 0.77 mg/dL).   Medical History: Past Medical History:  Diagnosis Date  . Arthritis   . Diabetes mellitus without complication ([place])   . DVT (deep venous thrombosis) ([place])   . Hyperlipidemia   . Ulcer of foot (H[facility])     Medications:  apixaban 5 mg BID -- last dose 04/23 AM  Assessment: 85 y.o. female withPMHa. Fib, DVT on apixaban 5 mg BID PTA who presented to the ED 4/21 from [facility] admitted with acute hypoxic respiratory failure requiring intubation. Patient with self extubation 4/23. Pharmacy has been consulted to transition from oral apixaban to parenteral enoxaparin.  Goal of Therapy:  Monitor platelets by anticoagulation protocol: Yes   Plan:  Lovenox 1 mg/kg (70 mg) subcutaneously Q12H Monitor renal function and CBC daily  Dorothe Pea, PharmD, BCPS 04/05/2021,10:01 PM

## 2021-04-06 DIAGNOSIS — J9601 Acute respiratory failure with hypoxia: Secondary | ICD-10-CM

## 2021-04-06 DIAGNOSIS — R0603 Acute respiratory distress: Secondary | ICD-10-CM | POA: Diagnosis not present

## 2021-04-06 LAB — BASIC METABOLIC PANEL
Anion gap: 9 (ref 5–15)
BUN: 15 mg/dL (ref 8–23)
CO2: 25 mmol/L (ref 22–32)
Calcium: 8.3 mg/dL — ABNORMAL LOW (ref 8.9–10.3)
Chloride: 106 mmol/L (ref 98–111)
Creatinine, Ser: 0.59 mg/dL (ref 0.44–1.00)
GFR, Estimated: >60 mL/min (ref >60)
Glucose, Bld: 82 mg/dL (ref 70–99)
Potassium: 3.7 mmol/L (ref 3.5–5.1)
Sodium: 140 mmol/L (ref 135–145)

## 2021-04-06 LAB — GLUCOSE, CAPILLARY
Glucose-Capillary: 115 mg/dL — ABNORMAL HIGH (ref 70–99)
Glucose-Capillary: 60 mg/dL — ABNORMAL LOW (ref 70–99)
Glucose-Capillary: 67 mg/dL — ABNORMAL LOW (ref 70–99)
Glucose-Capillary: 73 mg/dL (ref 70–99)
Glucose-Capillary: 75 mg/dL (ref 70–99)
Glucose-Capillary: 84 mg/dL (ref 70–99)
Glucose-Capillary: 94 mg/dL (ref 70–99)
Glucose-Capillary: 98 mg/dL (ref 70–99)

## 2021-04-06 LAB — CBC
HCT: 35.8 % — ABNORMAL LOW (ref 36.0–46.0)
Hemoglobin: 11.5 g/dL — ABNORMAL LOW (ref 12.0–15.0)
MCH: 28 pg (ref 26.0–34.0)
MCHC: 32.1 g/dL (ref 30.0–36.0)
MCV: 87.1 fL (ref 80.0–100.0)
Platelets: 237 10*3/uL (ref 150–400)
RBC: 4.11 MIL/uL (ref 3.87–5.11)
RDW: 16.2 % — ABNORMAL HIGH (ref 11.5–15.5)
WBC: 17.2 10*3/uL — ABNORMAL HIGH (ref 4.0–10.5)
nRBC: 0 % (ref 0.0–0.2)

## 2021-04-06 LAB — PHOSPHORUS: Phosphorus: 2.5 mg/dL (ref 2.5–4.6)

## 2021-04-06 LAB — LEGIONELLA PNEUMOPHILA SEROGP 1 UR AG: L. pneumophila Serogp 1 Ur Ag: NEGATIVE

## 2021-04-06 LAB — MAGNESIUM: Magnesium: 2.1 mg/dL (ref 1.7–2.4)

## 2021-04-06 MED ORDER — METOPROLOL TARTRATE 5 MG/5ML IV SOLN
5.0000 mg | Freq: Once | INTRAVENOUS | Status: AC
  Administered 2021-04-06: 5 mg via INTRAVENOUS

## 2021-04-06 MED ORDER — POTASSIUM CHLORIDE 10 MEQ/100ML IV SOLN
10.0000 meq | INTRAVENOUS | Status: AC
  Administered 2021-04-06 (×4): 10 meq via INTRAVENOUS
  Filled 2021-04-06 (×4): qty 100

## 2021-04-06 MED ORDER — METOPROLOL TARTRATE 5 MG/5ML IV SOLN
INTRAVENOUS | Status: AC
  Filled 2021-04-06: qty 5

## 2021-04-06 MED ORDER — LORAZEPAM 2 MG/ML IJ SOLN
INTRAMUSCULAR | Status: AC
  Filled 2021-04-06: qty 1

## 2021-04-06 MED ORDER — DEXTROSE 50 % IV SOLN
INTRAVENOUS | Status: AC
  Filled 2021-04-06: qty 50

## 2021-04-06 MED ORDER — DEXTROSE 50 % IV SOLN
12.5000 g | INTRAVENOUS | Status: AC
  Administered 2021-04-06: 12.5 g via INTRAVENOUS

## 2021-04-06 MED ORDER — DEXTROSE IN LACTATED RINGERS 5 % IV SOLN: INTRAVENOUS | Status: DC

## 2021-04-06 MED ORDER — METOPROLOL TARTRATE 5 MG/5ML IV SOLN: 5.0000 mg | Freq: Once | INTRAVENOUS | Status: DC

## 2021-04-06 MED ORDER — LORAZEPAM 2 MG/ML IJ SOLN
1.0000 mg | Freq: Once | INTRAMUSCULAR | Status: AC
  Administered 2021-04-06: 1 mg via INTRAVENOUS

## 2021-04-06 MED ORDER — ENOXAPARIN SODIUM 80 MG/0.8ML ~~LOC~~ SOLN
1.0000 mg/kg | Freq: Two times a day (BID) | SUBCUTANEOUS | Status: DC
  Administered 2021-04-06 – 2021-04-07 (×3): 70 mg via SUBCUTANEOUS
  Filled 2021-04-06 (×3): qty 0.8

## 2021-04-06 MED ORDER — DILTIAZEM HCL-DEXTROSE 125-5 MG/125ML-% IV SOLN (PREMIX)
5.0000 mg/h | INTRAVENOUS | Status: DC
  Administered 2021-04-06: 5 mg/h via INTRAVENOUS
  Filled 2021-04-06: qty 125

## 2021-04-06 NOTE — Progress Notes (Signed)
Patient ID: Molly Hawkins, female   DOB: Nov 09, 1936, 85 y.o.   MRN: [id number]   Per ICU NP  85 y.o. female with PMH a. Fib, DVT, CVA (multiple embolic strokes 24/0973 with residual left arm weakness [facility]), HTN, HLD, carotid stenosis, osteoporosis who presented to the ED from [facility] admitted with acute hypoxic respiratory failure requiring intubation and sepsis with septic shock due to pneumonia and UTI. Extubation 4/23 doing well on nasal cannula sats 100%, alert and oriented x 2 but requires re-direction. Off pressors. Remains on Vanc, cefepime and Azithromycin. speech to eval prior to resuming oral meds  TRH hospitalist will pick up Monday 04/07/21

## 2021-04-06 NOTE — TOC Initial Note (Signed)
Transition of Care [facility]) - Initial/Assessment Note    Patient Details  Name: Molly Hawkins MRN: [id number] Date of Birth: [birth year]  Transition of Care [facility]) CM/SW Contact:    Adelene Amas, LCSWA Phone [phone] 04/06/2021, 11:15 AM  Clinical Narrative:                  Patient presents to [facility] due to unwitness fall from bed. Patient has been at [facility] for rehab for about three weeks.  CSW spoke with patient's daughter Charna Elizabeth [phone], who is her main contact and care giver.  CSW explained the role of TOC in patient care. Ms. Melina Modena stated she is the only child the patient has and is overwhelmed with the patient's care needs.  Ms. Melina Modena stated the patient went to [facility]ittance to [facility] for at UTI.Ms. Melina Modena stated before her stay at [facility] she was living with the patient due to the patient's need for 24 hour a day care.  Ms. Melina Modena stated she is no longer able to assist with the patient's needs and wants the patient to return to [facility] or another LTC facility when she discharges from the hospital. CSW asked Ms. West if she has spoken with the palliative care NO yet, she stated she had not.  CSW stated she would request a palliative care consult for a goals of car discussion.  Ms. Melina Modena is open to hospice care for the patient but stated she cannot physically care for the patient any longer.  CSW requested palliative care consult and will continue to follow patient for future TOC needs.  Expected Discharge Plan: Long Term Nursing Home Barriers to Discharge: Continued Medical Work up,Family Issues   Patient Goals and CMS Choice   CMS Medicare.gov Compare Post Acute Care list provided to:: Other (Comment Required) Choice offered to / list presented to : Adult Children Jessica Priest B (Daughter)   [phone])  Expected Discharge Plan and Services Expected Discharge Plan: Long Term Nursing Home In-house Referral: Clinical Social Work   Post Acute Care Choice:  [place] Living arrangements for the past 2 months: [place] ([facility])                                      Prior Living Arrangements/Services Living arrangements for the past 2 months: [place] ([facility]) Lives with:: Adult Children (Jessica Priest B (Daughter)   [phone]) Patient language and need for interpreter reviewed:: Yes Do you feel safe going back to the place where you live?: Yes      Need for Family Participation in Patient Care: Yes (Comment) Care giver support system in place?: Yes (comment)   Criminal Activity/Legal Involvement Pertinent to Current Situation/Hospitalization: No - Comment as needed  Activities of Daily Living Home Assistive Devices/Equipment: Oxygen (UTA) ADL Screening (condition at time of admission) Patient's cognitive ability adequate to safely complete daily activities?: No Is the patient deaf or have difficulty hearing?: No Does the patient have difficulty seeing, even when wearing glasses/contacts?: No Does the patient have difficulty concentrating, remembering, or making decisions?: Yes Patient able to express need for assistance with ADLs?: No Does the patient have difficulty dressing or bathing?: Yes Independently performs ADLs?: No Does the patient have difficulty walking or climbing stairs?: Yes Weakness of Legs: Both Weakness of Arms/Hands: Both  Permission Sought/Granted Permission sought to share information with : Family Supports  Share Information with NAME: Lorre Nick (Daughter)   [phone]           Emotional Assessment Appearance:: Appears older than stated age Attitude/Demeanor/Rapport: Unable to Assess Affect (typically observed): Unable to Assess Orientation: : Fluctuating Orientation (Suspected and/or reported Sundowners) Alcohol / Substance Use: Not Applicable Psych Involvement: No (comment)  Admission diagnosis:  Acute respiratory distress  [R06.03] Pain [R52] Hypoxia [R09.02] Acute respiratory failure with hypoxia ([place]) [J96.01] Patient Active Problem List   Diagnosis Date Noted  . Protein-calorie malnutrition, severe 04/05/2021  . Acute respiratory failure with hypoxia ([place]) 04/04/2021  . Pressure injury of skin 04/04/2021  . Hay fever 05/24/2015  . Essential (primary) hypertension 05/24/2015  . Hypercholesteremia 05/24/2015  . B12 deficiency 05/24/2015  . Basal cell carcinoma of face 10/04/2014  . Broken humerus 04/03/2014  . Closed fracture of neck of right femur ([place]) 10/03/2013  . Blood glucose elevated 05/31/2013  . Carotid artery plaque 04/25/2013  . Vascular disorder of lower extremity 03/22/2012  . Long term current use of anticoagulant 02/22/2012  . Avitaminosis D 02/22/2012   PCP:  Gayland Curry, MD Pharmacy:   [facility] [address] [place] - [place] [address] Phone: [phone] Fax: [phone]     Social Determinants of Health (SDOH) Interventions    Readmission Risk Interventions No flowsheet data found.

## 2021-04-06 NOTE — Progress Notes (Addendum)
NAME:  Molly Hawkins, MRN:  [id number], DOB:  [birth year], LOS: 2 ADMISSION DATE:  04/04/2021, INITIAL CONSULTATION DATE:  REFERRING MD:  Marjean Donna MD, CHIEF COMPLAINT: Shortness of Breath  Brief Patient Description  85 y.o. female with PMH a. Fib, DVT, CVA (multiple embolic strokes 123XX123 with residual left arm weakness [facility]), HTN, HLD, carotid stenosis, osteoporosis who presented [facility] the [facility] admitted with acute hypoxic respiratory failure requiring intubation and sepsis with septic shock due to pneumonia and UTI  Pertinent  Medical History  Atrial fibrillation DVT CVA (multiple embolic strokes 123XX123) with residual left arm weakness Carotid stenosis Osteoporosis Complicated cystitis Diabetes mellitus Arthritis Hyperlipidemia Foot ulcers  Significant Hospital Events: Including procedures, antibiotic start and stop dates in addition to other pertinent events   4/22: Admitted to PCCM service 4/23: Patient self extubated  Procedures:  4/22: Intubation  Significant Diagnostic Tests:  4/22: Chest Xray> 4/22: Noncontrast CT head>no acute intracranial abnormality  4/22: CT cervical Spine> No evidence of acute cervical spine fracture, traumatic subluxation or static signs of instability 4/22: CTA abdomen and pelvis > no acute abnormality, chronic right pelvic fractures was noted 4/22: CTA Chest>no evidence of pulmonary embolism however a right lower lobe opacity concerning for pneumonia was noted  Micro Data:  4/22: SARS-CoV-2 PCR>> negativ 4/22: Influenza PCR> negative 4/22: Blood culture x2> pending 4/22: Urine Cx> contaminated 4/22: MRSA PCR> Positive 4/22: Strep pneumo urinary antigen > negative 4/22: Legionella urinary antigen> 4/22: Mycoplasma pneumonia> pending   Antimicrobials:  Cefepime 4/22>> Vancomycin 4/22>>  Interim History / Subjective:  Patient self extubated on 4/23, doing well on nasal cannula with sats 100%  OBJECTIVE   Blood  pressure (!) 150/69, pulse 84, temperature 97.7 F (36.5 C), temperature source Axillary, resp. rate (!) 21, height 5\' 5"  (1.651 m), weight 70.1 kg, SpO2 97 %.    Vent Mode: PSV;Spontaneous FiO2 (%):  [28 %] 28 % PEEP:  [5 cmH20] 5 cmH20 Pressure Support:  [5 cmH20] 5 cmH20   Intake/Output Summary (Last 24 hours) at 04/06/2021 0847 Last data filed at 04/06/2021 0800 Gross per 24 hour  Intake 1275.78 ml  Output 3050 ml  Net -1774.22 ml   Filed Weights   04/04/21 1145 04/05/21 0330 04/06/21 0500  Weight: 67.7 kg 70.1 kg 70.1 kg   Physical Examination: GENERAL:85 year-old critically ill patient lying in the bed in no acute distress EYES: Pupils equal, round, reactive to light and accommodation. No scleral icterus. Extraocular muscles intact.  HEENT: Head atraumatic, normocephalic. Oropharynx and nasopharynx clear.  NECK:  Supple, no jugular venous distention. No thyroid enlargement, no tenderness.  LUNGS: Coarse breath sounds bilaterally, no wheezing, rales,rhonchi or crepitation. No use of accessory muscles of respiration.  CARDIOVASCULAR: S1, S2 normal. No murmurs, rubs, or gallops.  ABDOMEN: Soft, nontender, nondistended. Bowel sounds present. No organomegaly or mass.  EXTREMITIES: No pedal edema, cyanosis, or clubbing.  NEUROLOGIC: Left MCA exam: Patient awake, alert. Speech slurred but comprehensible. Unable to follow commands consistently. Extraocular movements intact. Slight Right homonomyous hemianopia. no facial weakness appreciated. Right upper extremity with 2/5 strength. Does not cross midline. Right lower extremity with 3/5 strength. Moves left arm and leg without difficulty, strength normal. Left arm orbits right. Plantars down on the right, up on  the left. Decreased sensation on the right side of the body.  PSYCHIATRIC: Unable to assess. SKIN: No obvious rash, lesion, or ulcer.   Labs/imaging that I havepersonally reviewed  (right click and "Reselect all  SmartList  Selections" daily)    Labs   CBC: Recent Labs  Lab 04/03/21 2038 04/04/21 0639 04/05/21 0429 04/06/21 0506  WBC 11.1* 13.8* 24.8* 17.2*  NEUTROABS 6.6 8.4*  --   --   HGB 13.8 13.7 11.6* 11.5*  HCT 43.9 43.5 37.9 35.8*  MCV 88.7 88.6 90.9 87.1  PLT 310 340 227 782    Basic Metabolic Panel: Recent Labs  Lab 04/03/21 2038 04/04/21 0639 04/05/21 0429 04/06/21 0506  NA 143 143 140 140  K 3.8 3.7 3.3* 3.7  CL 99 101 104 106  CO2 30 28 25 25   GLUCOSE 124* 148* 126* 82  BUN 14 16 17 15   CREATININE 0.72 0.74 0.77 0.59  CALCIUM 9.2 9.4 8.2* 8.3*  MG  --   --  1.4* 2.1  PHOS  --   --  2.7 2.5   GFR: Estimated Creatinine Clearance: 53.2 mL/min (by C-G formula based on SCr of 0.59 mg/dL). Recent Labs  Lab 04/03/21 2038 04/04/21 0639 04/04/21 1043 04/04/21 1214 04/04/21 1216 04/04/21 1534 04/04/21 1840 04/04/21 2139 04/05/21 0429 04/06/21 0506  PROCALCITON  --   --   --  <0.10  --   --   --   --   --   --   WBC 11.1* 13.8*  --   --   --   --   --   --  24.8* 17.2*  LATICACIDVEN  --  1.9   < >  --  5.2* 3.9* 2.2* 1.9  --   --    < > = values in this interval not displayed.    Liver Function Tests: Recent Labs  Lab 04/04/21 0639  AST 20  ALT 11  ALKPHOS 101  BILITOT 0.9  PROT 7.3  ALBUMIN 3.0*   No results for input(s): LIPASE, AMYLASE in the last 168 hours. No results for input(s): AMMONIA in the last 168 hours.  ABG    Component Value Date/Time   PHART 7.47 (H) 04/05/2021 0453   PCO2ART 36 04/05/2021 0453   PO2ART 148 (H) 04/05/2021 0453   HCO3 26.2 04/05/2021 0453   O2SAT 99.4 04/05/2021 0453     Coagulation Profile: Recent Labs  Lab 04/04/21 0639  INR 1.2    Cardiac Enzymes: Recent Labs  Lab 04/04/21 0630  CKTOTAL 30*    HbA1C: No results found for: HGBA1C  CBG: Recent Labs  Lab 04/05/21 2005 04/05/21 2341 04/06/21 0343 04/06/21 0733 04/06/21 0811  GLUCAP 72 76 73 67* 94    Allergies Allergies  Allergen Reactions  .  Atorvastatin     Other reaction(s): Unknown  . Codeine     Stomach problems  . Ezetimibe     Other reaction(s): Unknown  . Statins     Other reaction(s): Unknown     Home Medications  Prior to Admission medications   Medication Sig Start Date End Date Taking? Authorizing Provider  acetaminophen (TYLENOL) 325 MG tablet Take 650 mg by mouth every 6 (six) hours as needed.   Yes [provider]  acetaminophen (TYLENOL) 650 MG suppository Place 650 mg rectally every 6 (six) hours as needed for fever.   Yes [provider]  apixaban (ELIQUIS) 5 MG TABS tablet Take 5 mg by mouth 2 (two) times daily.   Yes [provider]  atorvastatin (LIPITOR) 40 MG tablet Take 40 mg by mouth at bedtime.   Yes [provider]  dextromethorphan-guaiFENesin (TUSSIN DM) 10-100 MG/5ML liquid Take 10 mLs  by mouth every 6 (six) hours as needed for cough.   Yes [provider]  Ensure (ENSURE) Take 237 mLs by mouth daily with lunch.   Yes [provider]  levETIRAcetam (KEPPRA) 750 MG tablet Take 750 mg by mouth 2 (two) times daily.   Yes [provider]  metoprolol tartrate (LOPRESSOR) 25 MG tablet Take 12.5 mg by mouth 2 (two) times daily.   Yes [provider]  ondansetron (ZOFRAN-ODT) 4 MG disintegrating tablet Take 4 mg by mouth every 6 (six) hours as needed for nausea or vomiting.   Yes [provider]  QUEtiapine (SEROQUEL) 25 MG tablet Take 25 mg by mouth 2 (two) times daily.   Yes [provider]    No current facility-administered medications on file prior to encounter.   Current Outpatient Medications on File Prior to Encounter  Medication Sig Dispense Refill  . acetaminophen (TYLENOL) 325 MG tablet Take 650 mg by mouth every 6 (six) hours as needed.    Marland Kitchen acetaminophen (TYLENOL) 650 MG suppository Place 650 mg rectally every 6 (six) hours as needed for fever.    Marland Kitchen apixaban (ELIQUIS) 5 MG TABS tablet Take 5 mg by  mouth 2 (two) times daily.    Marland Kitchen atorvastatin (LIPITOR) 40 MG tablet Take 40 mg by mouth at bedtime.    Marland Kitchen dextromethorphan-guaiFENesin (TUSSIN DM) 10-100 MG/5ML liquid Take 10 mLs by mouth every 6 (six) hours as needed for cough.    . Ensure (ENSURE) Take 237 mLs by mouth daily with lunch.    . levETIRAcetam (KEPPRA) 750 MG tablet Take 750 mg by mouth 2 (two) times daily.    . metoprolol tartrate (LOPRESSOR) 25 MG tablet Take 12.5 mg by mouth 2 (two) times daily.    . ondansetron (ZOFRAN-ODT) 4 MG disintegrating tablet Take 4 mg by mouth every 6 (six) hours as needed for nausea or vomiting.    Marland Kitchen QUEtiapine (SEROQUEL) 25 MG tablet Take 25 mg by mouth 2 (two) times daily.      Resolved Hospital Problem list     ASSESSMENT & PLAN   Acute Hypoxic Respiratory Failure secondary to Pneumonia, Aspiration,  S/p self extubation 4/23 - Supplemental O2 as needed to maintain O2 saturations 88 to 92% - High risk for re-intubation - Follow intermittent ABG and chest x-ray as needed - Aggressive pulmonary hygiene post extubation - As needed bronchodilators   Sepsis with septic shock due to Pneumonia and Urinary Tract Infection PMHx: Patient had 1/2 positive blood cultures growing staph epi and staph hominis at [facility] on 2/22, likely contaminant while admitted at [facility].  Repeat blood cultures from 2/23 negative thought to be mixed urogenital flora.  Patient was treated with CFTX 1 g x 5 days (2/25-3/1) - F/u blood cultures, Urine Cx,  trend PCT - Monitor WBC/ fever curve - IV antibiotics: cefepime & vancomycin &Azithromycin - IVF hydration as needed:  - off pressors now - Strict I/O's:goal UOP > 0.5 mL/kg/hr   Acute Metabolic Encephalopathy  Hx of underlying Dementia per daughter on Seroquel BID -Provide supportive care    History of seizures -high risk for seizure activity in the setting of sepsis continuous EEG at [facility] on 2/22, which showed focal discharges, LPDs over left temporal region,  generalized rhythmic delta activity, slowing in left and right head regions, and rare GPDs with triphasic morphology. - EEG done on 4/22 shows severe diffuse encephalopathy, no seizure activity noted - Continue maintenance with Keppra 500 mg twice daily -  We will treat underlying infectious process as above - Seizure precaution   Paroxysmal atrial fibrillation - Rate controlled - Continue metoprolol IV for now since unable to take po - On Eliquis 5 mg BID, will change to Lovenox since she is unable to take po pending speech eval. - Keep Mg>2 and K>4 - I's and O's   Diabetes mellitus Episode of hypoglycemia - CBGs - Sliding scale insulin - Follow ICU hyper/hypoglycemia protocol   History of multiple embolic infarcts Visualized on MRI brain 11/30/2020 which showed multiple infarcts in the bilateral frontal/parietal/occipital lobes and left temporal lobe - Continue home Eliquis 5 mg twice daily once able to tolerate po - Continue Atorvastatin  - Recommend follow-up MRI brain, however daughter would like to defer   #Poor PO intake Secondary to encephalopathy - Calorie count - NPO for now at high risk for aspiration - Speech eval pending     Best practice (right click and "Reselect all SmartList Selections" daily)   Diet: NPO Pain/Anxiety/Delirium protocol (if indicated): Yes (RASS goal -1) VAP protocol (if indicated): NO DVT prophylaxis: Systemic AC GI prophylaxis: H2B Glucose control: SSI Yes Central venous access: N/A Arterial line: N/A Foley: Yes, and it is still needed Mobility: bed rest PT consulted: N/A Last date of multidisciplinary goals of care discussion[4/22] Code Status: DNR Disposition:stepdown     Rufina Falco, DNP, FNP-C, AGACNP-BC Acute Care Nurse Practitioner  [facility] Pager: [phone] [place] [facility]

## 2021-04-07 ENCOUNTER — Inpatient Hospital Stay: Payer: Medicare Other

## 2021-04-07 DIAGNOSIS — L8995 Pressure ulcer of unspecified site, unstageable: Secondary | ICD-10-CM

## 2021-04-07 DIAGNOSIS — Z515 Encounter for palliative care: Secondary | ICD-10-CM

## 2021-04-07 DIAGNOSIS — R0603 Acute respiratory distress: Secondary | ICD-10-CM

## 2021-04-07 DIAGNOSIS — Z7189 Other specified counseling: Secondary | ICD-10-CM

## 2021-04-07 LAB — MAGNESIUM: Magnesium: 1.7 mg/dL (ref 1.7–2.4)

## 2021-04-07 LAB — CBC
HCT: 35.4 % — ABNORMAL LOW (ref 36.0–46.0)
Hemoglobin: 11.3 g/dL — ABNORMAL LOW (ref 12.0–15.0)
MCH: 28.2 pg (ref 26.0–34.0)
MCHC: 31.9 g/dL (ref 30.0–36.0)
MCV: 88.3 fL (ref 80.0–100.0)
Platelets: 211 10*3/uL (ref 150–400)
RBC: 4.01 MIL/uL (ref 3.87–5.11)
RDW: 15.9 % — ABNORMAL HIGH (ref 11.5–15.5)
WBC: 13.8 10*3/uL — ABNORMAL HIGH (ref 4.0–10.5)
nRBC: 0 % (ref 0.0–0.2)

## 2021-04-07 LAB — URINE CULTURE: Culture: NO GROWTH

## 2021-04-07 LAB — GLUCOSE, CAPILLARY
Glucose-Capillary: 89 mg/dL (ref 70–99)
Glucose-Capillary: 76 mg/dL (ref 70–99)
Glucose-Capillary: 83 mg/dL (ref 70–99)

## 2021-04-07 LAB — BASIC METABOLIC PANEL
Anion gap: 7 (ref 5–15)
BUN: 10 mg/dL (ref 8–23)
CO2: 28 mmol/L (ref 22–32)
Calcium: 8.1 mg/dL — ABNORMAL LOW (ref 8.9–10.3)
Chloride: 103 mmol/L (ref 98–111)
Creatinine, Ser: 0.47 mg/dL (ref 0.44–1.00)
GFR, Estimated: >60 mL/min (ref >60)
Glucose, Bld: 93 mg/dL (ref 70–99)
Potassium: 3.1 mmol/L — ABNORMAL LOW (ref 3.5–5.1)
Sodium: 138 mmol/L (ref 135–145)

## 2021-04-07 LAB — PHOSPHORUS: Phosphorus: 2.2 mg/dL — ABNORMAL LOW (ref 2.5–4.6)

## 2021-04-07 MED ORDER — ONDANSETRON 4 MG PO TBDP
4.0000 mg | ORAL_TABLET | Freq: Four times a day (QID) | ORAL | Status: DC | PRN
  Filled 2021-04-07: qty 1

## 2021-04-07 MED ORDER — GLYCOPYRROLATE 0.2 MG/ML IJ SOLN: 0.2000 mg | INTRAMUSCULAR | Status: DC | PRN

## 2021-04-07 MED ORDER — HALOPERIDOL LACTATE 5 MG/ML IJ SOLN
0.5000 mg | INTRAMUSCULAR | Status: DC | PRN
  Administered 2021-04-08 – 2021-04-09 (×5): 0.5 mg via INTRAVENOUS
  Filled 2021-04-07 (×5): qty 1

## 2021-04-07 MED ORDER — BIOTENE DRY MOUTH MT LIQD: 15.0000 mL | OROMUCOSAL | Status: DC | PRN

## 2021-04-07 MED ORDER — LORAZEPAM 2 MG/ML IJ SOLN: 1.0000 mg | INTRAMUSCULAR | Status: DC | PRN

## 2021-04-07 MED ORDER — MORPHINE SULFATE (PF) 2 MG/ML IV SOLN
1.0000 mg | INTRAVENOUS | Status: DC | PRN
  Administered 2021-04-07 – 2021-04-11 (×6): 1 mg via INTRAVENOUS
  Filled 2021-04-07 (×6): qty 1

## 2021-04-07 MED ORDER — GLYCOPYRROLATE 1 MG PO TABS
1.0000 mg | ORAL_TABLET | ORAL | Status: DC | PRN
  Filled 2021-04-07: qty 1

## 2021-04-07 MED ORDER — LORAZEPAM 2 MG/ML PO CONC
1.0000 mg | ORAL | Status: DC | PRN
  Filled 2021-04-07: qty 0.5

## 2021-04-07 MED ORDER — POTASSIUM PHOSPHATES 15 MMOLE/5ML IV SOLN
15.0000 mmol | Freq: Once | INTRAVENOUS | Status: DC
  Administered 2021-04-07: 15 mmol via INTRAVENOUS
  Filled 2021-04-07: qty 5

## 2021-04-07 MED ORDER — ACETAMINOPHEN 650 MG RE SUPP: 650.0000 mg | Freq: Four times a day (QID) | RECTAL | Status: DC | PRN

## 2021-04-07 MED ORDER — POLYVINYL ALCOHOL 1.4 % OP SOLN
1.0000 [drp] | Freq: Four times a day (QID) | OPHTHALMIC | Status: DC | PRN
  Filled 2021-04-07: qty 15

## 2021-04-07 MED ORDER — GLYCOPYRROLATE 0.2 MG/ML IJ SOLN
0.2000 mg | INTRAMUSCULAR | Status: DC | PRN
  Administered 2021-04-08 (×3): 0.2 mg via INTRAVENOUS
  Filled 2021-04-07 (×3): qty 1

## 2021-04-07 MED ORDER — HALOPERIDOL 0.5 MG PO TABS
0.5000 mg | ORAL_TABLET | ORAL | Status: DC | PRN
  Filled 2021-04-07: qty 1

## 2021-04-07 MED ORDER — LORAZEPAM 1 MG PO TABS: 1.0000 mg | ORAL_TABLET | ORAL | Status: DC | PRN

## 2021-04-07 MED ORDER — MAGNESIUM SULFATE 2 GM/50ML IV SOLN
2.0000 g | Freq: Once | INTRAVENOUS | Status: AC
  Administered 2021-04-07: 2 g via INTRAVENOUS
  Filled 2021-04-07: qty 50

## 2021-04-07 MED ORDER — POTASSIUM CHLORIDE 10 MEQ/100ML IV SOLN
10.0000 meq | INTRAVENOUS | Status: AC
  Administered 2021-04-07 (×4): 10 meq via INTRAVENOUS
  Filled 2021-04-07 (×4): qty 100

## 2021-04-07 MED ORDER — HALOPERIDOL LACTATE 2 MG/ML PO CONC
0.5000 mg | ORAL | Status: DC | PRN
  Filled 2021-04-07: qty 0.3

## 2021-04-07 MED ORDER — ACETAMINOPHEN 325 MG PO TABS: 650.0000 mg | ORAL_TABLET | Freq: Four times a day (QID) | ORAL | Status: DC | PRN

## 2021-04-07 MED ORDER — ONDANSETRON HCL 4 MG/2ML IJ SOLN: 4.0000 mg | Freq: Four times a day (QID) | INTRAMUSCULAR | Status: DC | PRN

## 2021-04-07 NOTE — Consult Note (Addendum)
Consultation Note Date: 04/07/2021   Patient Name: Molly Hawkins  DOB: [birth year]  MRN: [id number]  Age / Sex: 85 y.o., female  PCP: Gayland Curry, MD Referring Physician: Fritzi Mandes, MD  Reason for Consultation: Establishing goals of care  HPI/Patient Profile:  85 y.o. female with PMH a. Fib, DVT, CVA (multiple embolic strokes 33/3832 with residual left arm weakness [facility]), HTN, HLD, carotid stenosis, osteoporosis who [facility] with complaint of shortness of breath.  Clinical Assessment and Goals of Care: Patient is resting in bed with eyes closed,  mittens in place. No family at bedside. Called to speak with daughter. Patient is the widow of a Environmental education officer. She has 1/2 children living.    Prior to October patient was fully independent and walking a mile per day. She fell in October and has declined since then. She progressed from a cane to a walker. She needed help with ADL's and had a caregiver come into the home 5 days per week. She discusses her previous UTI.   She discusses her stay in rehab. Patient's daughter states patient has always been very concerned with privacy and did not want to discuss issues in her life with anyone including her daughter. She tells me her mother has been extremely unhappy in the rehab facility, but was willing to stay there temporarily in an effort to move back home and be independent.    We discussed her diagnosis and prognosis in great detail. Discussed her [place], EOL wishes disposition and options.  Created space and opportunity for patient  to explore thoughts and feelings regarding current medical information. She states she does not want her mother to suffer. Malachy Mood states her father was a Theme park manager, and so her family is a family of faith.  She states up to this point, she has prayed God would make a clear path that Ms. Antonson would  either improve significantly, or she would transition out of this world without her having to make any decisions. She does not want her mother to suffer.     A detailed discussion was had today regarding advanced directives.  Concepts specific to code status, artifical feeding and hydration, IV antibiotics and rehospitalization were discussed.  The difference between an aggressive medical intervention path and a comfort care path was discussed.  Values and goals of care important to patient and family were attempted to be elicited.  Discussed limitations of medical interventions to prolong quality of life in some situations and discussed the concept of human mortality.  Discussed various scenarios, none of which would provide a quality of life Ms. Heick would be accepting of.  She states she would like to shift to comfort focused care with transfer to the hospice facility.  She does not want any further life-prolonging care for her mother.  I completed a MOST form today through [employer] with daughter Jessica Priest. A photocopy was placed in the chart to be scanned into EMR. The patient's daughter outlined their wishes for the  following treatment decisions:  Cardiopulmonary Resuscitation: Do Not Attempt Resuscitation (DNR/No CPR)  Medical Interventions: Comfort Measures: Keep clean, warm, and dry. Use medication by any route, positioning, wound care, and other measures to relieve pain and suffering. Use oxygen, suction and manual treatment of airway obstruction as needed for comfort. Do not transfer to the hospital unless comfort needs cannot be met in current location.  Antibiotics: No antibiotics (use other measures to relieve symptoms)  IV Fluids: No IV fluids (provide other measures to ensure comfort)  Feeding Tube: No feeding tube    SUMMARY OF RECOMMENDATIONS   Recommend hospice facility placement.    Prognosis:   < 2 weeks  Discharge Planning: Hospice facility      Primary  Diagnoses: Present on Admission: . Acute respiratory failure with hypoxia ([place])   I have reviewed the medical record, interviewed the patient and family, and examined the patient. The following aspects are pertinent.  Past Medical History:  Diagnosis Date  . Arthritis   . Diabetes mellitus without complication ([place])   . DVT (deep venous thrombosis) ([place])   . Hyperlipidemia   . Ulcer of foot ([place])    Social History   Socioeconomic History  . Marital status: Widowed    Spouse name: Not on file  . Number of children: Not on file  . Years of education: Not on file  . Highest education level: Not on file  Occupational History  . Not on file  Tobacco Use  . Smoking status: Never Smoker  . Smokeless tobacco: Never Used  Substance and Sexual Activity  . Alcohol use: No  . Drug use: No  . Sexual activity: Not on file  Other Topics Concern  . Not on file  Social History Narrative  . Not on file   Social Determinants of Health   Financial Resource Strain: Not on file  Food Insecurity: Not on file  Transportation Needs: Not on file  Physical Activity: Not on file  Stress: Not on file  Social Connections: Not on file   Family History  Problem Relation Age of Onset  . Bladder Cancer Son   . Diabetes Son   . Breast cancer Neg Hx    Scheduled Meds: . chlorhexidine gluconate (MEDLINE KIT)  15 mL Mouth Rinse BID  . Chlorhexidine Gluconate Cloth  6 each Topical Q0600  . enoxaparin (LOVENOX) injection  1 mg/kg Subcutaneous Q12H  . mouth rinse  15 mL Mouth Rinse 10 times per day  . metoprolol tartrate  5 mg Intravenous Q6H  . mupirocin ointment  1 application Nasal BID   Continuous Infusions: . sodium chloride Stopped (04/05/21 0335)  . azithromycin 500 mg (04/07/21 1030)  . ceFEPime (MAXIPIME) IV 2 g (04/07/21 1015)  . dextrose 5% lactated ringers 50 mL/hr at 04/07/21 0800  . diltiazem (CARDIZEM) infusion Stopped (04/07/21 0356)  . famotidine (PEPCID) IV 20 mg (04/07/21  1047)  . levETIRAcetam 750 mg (04/07/21 1016)  . potassium PHOSPHATE IVPB (in mmol) 15 mmol (04/07/21 0820)  . vancomycin 1,250 mg (04/07/21 0820)   PRN Meds:.docusate, ipratropium-albuterol Medications Prior to Admission:  Prior to Admission medications   Medication Sig Start Date End Date Taking? Authorizing Provider  acetaminophen (TYLENOL) 325 MG tablet Take 650 mg by mouth every 6 (six) hours as needed.   Yes [provider]  acetaminophen (TYLENOL) 650 MG suppository Place 650 mg rectally every 6 (six) hours as needed for fever.   Yes [provider]  apixaban (ELIQUIS) 5 MG  TABS tablet Take 5 mg by mouth 2 (two) times daily.   Yes [provider]  atorvastatin (LIPITOR) 40 MG tablet Take 40 mg by mouth at bedtime.   Yes [provider]  dextromethorphan-guaiFENesin (TUSSIN DM) 10-100 MG/5ML liquid Take 10 mLs by mouth every 6 (six) hours as needed for cough.   Yes [provider]  Ensure (ENSURE) Take 237 mLs by mouth daily with lunch.   Yes [provider]  levETIRAcetam (KEPPRA) 750 MG tablet Take 750 mg by mouth 2 (two) times daily.   Yes [provider]  metoprolol tartrate (LOPRESSOR) 25 MG tablet Take 12.5 mg by mouth 2 (two) times daily.   Yes [provider]  ondansetron (ZOFRAN-ODT) 4 MG disintegrating tablet Take 4 mg by mouth every 6 (six) hours as needed for nausea or vomiting.   Yes [provider]  QUEtiapine (SEROQUEL) 25 MG tablet Take 25 mg by mouth 2 (two) times daily.   Yes [provider]   Allergies  Allergen Reactions  . Atorvastatin     Other reaction(s): Unknown  . Codeine     Stomach problems  . Ezetimibe     Other reaction(s): Unknown  . Statins     Other reaction(s): Unknown   Review of Systems  Unable to perform ROS   Physical Exam Constitutional:      Comments: Eyes closed.  Mittens in place.     Vital Signs: BP (!) 128/50   Pulse 80   Temp (!) 97.2  F (36.2 C) (Oral)   Resp 16   Ht 5' 5"  (1.651 m)   Wt 70.1 kg   SpO2 100%   BMI 25.72 kg/m  Pain Scale: PAINAD   Pain Score: 0-No pain   SpO2: SpO2: 100 % O2 Device:SpO2: 100 % O2 Flow Rate: .O2 Flow Rate (L/min): 4 L/min  IO: Intake/output summary:   Intake/Output Summary (Last 24 hours) at 04/07/2021 1321 Last data filed at 04/07/2021 0800 Gross per 24 hour  Intake 1316.69 ml  Output 2550 ml  Net -1233.31 ml    LBM: Last BM Date: 04/06/21 Baseline Weight: Weight: 67.7 kg Most recent weight: Weight: 70.1 kg      Time In: 12:30 Time Out: 1:40 Time Total: 70 min Greater than 50%  of this time was spent counseling and coordinating care related to the above assessment and plan.  Signed by: Asencion Gowda, NP   Please contact Palliative Medicine Team phone at [phone] for questions and concerns.  For individual provider: See Shea Evans

## 2021-04-07 NOTE — Progress Notes (Addendum)
[facility]) Hospital Liaison RN note:  Received request from Asencion Gowda, NP for family interest in [facility] [place]. Chart reviewed and eligibility was approved. Spoke with daughter, Malachy Mood to confirm interest and explain services. She verbalized understanding and questions were answered. Unfortunately, H[facility]ice [facility] is not able to offer a room today. Hospital care team is aware. [facility] Liaison will continue to follow for room availability.  Please call with any hospice related questions or concerns.  Thank you for the opportunity to participate in this patient's care.  Zandra Abts, RN [facility] Liaison  [phone]

## 2021-04-07 NOTE — Progress Notes (Addendum)
[place] at [place] NAME: Molly Hawkins    MR#:  [id number]  DATE OF BIRTH:  11-28-1936  SUBJECTIVE:  patient is transition to comfort care after palliative care had discussion with daughter. No family during my evaluation. Patient resting quietly  REVIEW OF SYSTEMS:   Review of Systems  Unable to perform ROS: Mental status change    DRUG ALLERGIES:   Allergies  Allergen Reactions  . Atorvastatin     Other reaction(s): Unknown  . Codeine     Stomach problems  . Ezetimibe     Other reaction(s): Unknown  . Statins     Other reaction(s): Unknown    VITALS:  Blood pressure (!) 149/56, pulse 79, temperature (!) 97.2 F (36.2 C), temperature source Oral, resp. rate 14, height 5\' 5"  (1.651 m), weight 70.1 kg, SpO2 98 %.  PHYSICAL EXAMINATION:   Physical Exam Limited--comfort care GENERAL:  85 y.o.-year-old patient lying in the bed with no acute distress.thin fraile ,cachectic  LUNGS: Normal breath sounds bilaterally, no wheezing, rales, rhonchi. CARDIOVASCULAR: S1, S2 normal. No murmurs, rubs, or gallops.  EXTREMITIES: No cyanosis, clubbing or edema b/l.    PSYCHIATRIC:  patient is lethargic   LABORATORY PANEL:  CBC Recent Labs  Lab 04/07/21 0324  WBC 13.8*  HGB 11.3*  HCT 35.4*  PLT 211    Chemistries  Recent Labs  Lab 04/04/21 0639 04/05/21 0429 04/07/21 0324  NA 143   < > 138  K 3.7   < > 3.1*  CL 101   < > 103  CO2 28   < > 28  GLUCOSE 148*   < > 93  BUN 16   < > 10  CREATININE 0.74   < > 0.47  CALCIUM 9.4   < > 8.1*  MG  --    < > 1.7  AST 20  --   --   ALT 11  --   --   ALKPHOS 101  --   --   BILITOT 0.9  --   --    < > = values in this interval not displayed.   Cardiac Enzymes No results for input(s): TROPONINI in the last 168 hours. RADIOLOGY:  DG Chest Port 1 View  Result Date: 04/07/2021 CLINICAL DATA:  Shortness of breath. EXAM: PORTABLE CHEST 1 VIEW COMPARISON:  04/05/2021 and CT chest  04/04/2021. FINDINGS: Trachea is midline. Heart size stable. Thoracic aorta is calcified. Increasing volume loss in the lung bases. Developing airspace opacification in the right perihilar region. Difficult to exclude pleural fluid bilaterally. Elevated right hemidiaphragm. IMPRESSION: 1. Increasing bibasilar volume loss. 2. Developing right perihilar airspace opacification, difficult to exclude pneumonia. Electronically Signed   By: Lorin Picket M.D.   On: 04/07/2021 08:49   ASSESSMENT AND PLAN:  85 y.o. female withPMHa. Fib, DVT, CVA (multiple embolic strokes 33/2951 with residual left arm weakness [facility]), HTN, HLD, carotid stenosis, osteoporosiswho presented to the ED from [facility] admitted with acute hypoxic respiratory failure requiring intubation and sepsis with septic shock due to pneumonia and UTI  Acute Hypoxic Respiratory Failure secondary toPneumonia, Aspiration,  S/p self extubation 4/23 - Supplemental O2 as needed to maintain O2 saturations 88 to 92%  Sepsis with septic shock due to Pneumonia and Urinary Tract Infection PMHx:Patient had 1/2 positive blood cultures growing staph epi and staphhominisat [facility] on 2/22, likely contaminant while admitted at [facility]. Repeat blood cultures from 2/23 negative thought to be  mixed urogenital flora. Patient was treated with CFTX1 g x 5 days (7/06-2/3)  Acute Metabolic Encephalopathy  Hx of underlying Dementia   History of seizures  Paroxysmal atrial fibrillation- Rate controlled  Diabetes mellitus Episode of hypoglycemia  History of multiple embolic infarct  Nutrition Status: Nutrition Problem: Severe Malnutrition Etiology: social / environmental circumstances (advanced age) Signs/Symptoms: severe fat depletion,severe muscle depletion Interventions: Refer to RD note for recommendations     Patient is transition to comfort care after palliative care discussion with daughter and family. Hospice RN to see  patient.  CODE STATUS: DNR/DNI DVT Prophylaxis :comfort care Level of care: Stepdown Status is: Inpatient  Patient is transition to comfort care. May have hospital death. Hospice RN to see  discharge disposition likely hospital death versus hospice. TOTAL TIME TAKING CARE OF THIS PATIENT: *15* minutes.  >50% time spent on counselling and coordination of care  Note: This dictation was prepared with Dragon dictation along with smaller phrase technology. Any transcriptional errors that result from this process are unintentional.  Fritzi Mandes M.D    [facility]   CC: Primary care physician; Gayland Curry, MDPatient ID: Molly Hawkins, female   DOB: 1936/10/21, 85 y.o.   MRN: [id number]

## 2021-04-07 NOTE — Progress Notes (Signed)
Nutrition Follow-up  DOCUMENTATION CODES:   Severe malnutrition in context of social or environmental circumstances  INTERVENTION:   -RD will follow for diet advancement and add supplements as appropriate -RD to make further recommendations based upon goals of care discussions  NUTRITION DIAGNOSIS:   Severe Malnutrition related to social / environmental circumstances (advanced age) as evidenced by severe fat depletion,severe muscle depletion.  Ongoing  GOAL:   Patient will meet greater than or equal to 90% of their needs  Unmet  MONITOR:   Diet advancement,Labs,Weight trends,Skin,I & O's  REASON FOR ASSESSMENT:   Malnutrition Screening Tool    ASSESSMENT:   85 y.o. female with PMH of A Fib, DVT, CVA (multiple embolic strokes 70/6237 with residual left arm weakness [facility]), HTN, HLD, carotid stenosis, DM and osteoporosis who presented [facility] with complaint of shortness of breath and was found to have aspiration PNA and UTI  4/23- self-extubated 4/25- s/p SLP eval- remain NPO, pt unable to participate in swallow assessment  Reviewed I/O's: -3.3 L x 24 hours and +2.7 L since admission  UOP: 4.8 L x 24 hours  Pt very lethargic at time of visit and did not arouse to name being called.   Per chart review, plan for palliative care consult to discuss goals of care. Per TOC notes, pt daughter is open to hospice care.   Pt remains NPO; RD will make further recommendations based upon goals of care discussions. PEG placement is not recommended in pts with dementia.   Medications reviewed and include dextrose 5% in lactated ringers infusion @ 50 ml/hr and keppra.   Labs reviewed: K: 3.1 (on IV supplementation), CBGS: 76-89.  Diet Order:   Diet Order            Diet NPO time specified  Diet effective now                 EDUCATION NEEDS:   No education needs have been identified at this time  Skin:  Skin Assessment: Skin Integrity  Issues: Skin Integrity Issues:: Stage II,Other (Comment) Stage II: sacrum Other: MASD to medial perineum  Last BM:  04/06/21  Height:   Ht Readings from Last 1 Encounters:  04/04/21 5\' 5"  (1.651 m)    Weight:   Wt Readings from Last 1 Encounters:  04/06/21 70.1 kg    Ideal Body Weight:  56.8 kg  BMI:  Body mass index is 25.72 kg/m.  Estimated Nutritional Needs:   Kcal:  1700-1900  Protein:  85-100 grams  Fluid:  > 1.7 L    Loistine Chance, RD, LDN, [place] Registered Dietitian II Certified Diabetes Care and Education Specialist Please refer to [facility] for RD and/or RD on-call/weekend/after hours pager

## 2021-04-07 NOTE — Progress Notes (Signed)
PHARMACY CONSULT NOTE - FOLLOW UP  Pharmacy Consult for Electrolyte Monitoring and Replacement   Recent Labs: Potassium (mmol/L)  Date Value  04/07/2021 3.1 (L)  08/07/2013 4.1   Magnesium (mg/dL)  Date Value  04/07/2021 1.7   Calcium (mg/dL)  Date Value  04/07/2021 8.1 (L)   Calcium, Total (mg/dL)  Date Value  08/07/2013 8.5   Albumin (g/dL)  Date Value  04/04/2021 3.0 (L)  08/04/2013 3.4   Phosphorus (mg/dL)  Date Value  04/07/2021 2.2 (L)   Sodium (mmol/L)  Date Value  04/07/2021 138  08/07/2013 138     Assessment: 85yo female with PMH a. Fib, DVT, CVA (multiple embolic strokes 56/3893 with residual left arm weakness [facility]), HTN, HLD, carotid stenosis, osteoporosis who presented to the ED c/o shortness of breath. Pt intubated 4/22 then extubated 4/23. Pharmacy has been consulted for electrolyte management.   Goal of Therapy:  K 4.0 - 5.1 mmol/L Mg 2.0 -2.4 mmol/L All other electrolytes WNL  Plan:   K 3.1 - NP replaced with IV Kcl 37mEq x4  Phos 2.2 - NP replaced with 76mmol IV Kphos  Mg 1.7 - NP replaced with 2g IV Mg sulfate  Re-check electrolytes with AM labs  Sherilyn Banker, PharmD Pharmacy Resident  04/07/2021 6:37 AM

## 2021-04-07 NOTE — Progress Notes (Addendum)
Pt remains on 4L [place] overnight. Pt remains withdrawn + confused, incomprehensible speech. Mits remain on. 2nd PIV placement attempted by charge RN w/o success, IV TEAM consult put in. Foley in place. Pt remains on D5 in LR, 50 ml/hr. BG stable overnight. Dilt gtt started d/t AFIB RVR, w/ positive effects. Dilt stopped this AM @ 0400. Scheduled metop given @ 0000, held this AM @ 0600. Will continue to monitor.

## 2021-04-07 NOTE — Progress Notes (Signed)
Chart reviewed, Pt visited. Pt lethargic but not sedated per Nurse in the room. Pt is not able to participate with swallow eval at this time. Nursing is providing oral care and reports Pt becomes combative at times. Will follow up as Pt becomes more alert and less combative. Continue NPO for temporary nutrition, continue oral care.

## 2021-04-07 NOTE — TOC Progression Note (Signed)
Transition of Care [facility]) - CM/SW Discharge Note   Patient Details  Name: Molly Hawkins MRN: [id number] Date of Birth: [birth year]  [facility]) CM/SW Contact:  Ova Freshwater Phone Number: [phone] 04/07/2021, 5:04 PM   Clinical Narrative:     Transition to comfort care, followed by Kieth Brightly w/ [facility].  Pending eligibility Hospice House placement, patient may transition before hospice bed available.     Barriers to Discharge: Continued Medical Work up,Family Issues   Patient Goals and CMS Choice   CMS Medicare.gov Compare Post Acute Care list provided to:: Other (Comment Required) Choice offered to / list presented to : Adult Children Jessica Priest B (Daughter)   [phone])  Discharge Placement                       Discharge Plan and Services In-house Referral: Clinical Social Work   Post Acute Care Choice: [place]                               Social Determinants of Health (SDOH) Interventions     Readmission Risk Interventions No flowsheet data found.

## 2021-04-08 NOTE — Progress Notes (Signed)
[facility]) Hospital Liaison RN note:   Visited with patient at bedside. Unfortunately, Hospice Home does not have a room to offer today. Hospital care team is aware. Spoke with daughter, Malachy Mood, over the phone to provide update. [place] Liaison will continue to follow for room availability.  Please call with any hospice related questions or concerns.  Thank you for the opportunity to participate in this patient's care.  Zandra Abts, RN [facility] Liaison  [phone]

## 2021-04-08 NOTE — Progress Notes (Signed)
[place] at [place] NAME: Molly Hawkins    MR#:  [id number]  DATE OF BIRTH:  1936-05-24  SUBJECTIVE:  patient is transition to comfort care after palliative care had discussion with daughter. No family during my evaluation. Patient resting quietly  REVIEW OF SYSTEMS:   Review of Systems  Unable to perform ROS: Mental status change    DRUG ALLERGIES:   Allergies  Allergen Reactions  . Atorvastatin     Other reaction(s): Unknown  . Codeine     Stomach problems  . Ezetimibe     Other reaction(s): Unknown  . Statins     Other reaction(s): Unknown    VITALS:  Blood pressure (!) 161/113, pulse 83, temperature 97.7 F (36.5 C), resp. rate (!) 27, height 5\' 5"  (1.651 m), weight 70.1 kg, SpO2 95 %.  PHYSICAL EXAMINATION:   Physical Exam Limited--comfort care GENERAL:  85 y.o.-year-old patient lying in the bed with no acute distress.thin fraile ,cachectic  LUNGS: Normal breath sounds bilaterally, no wheezing, rales, rhonchi. CARDIOVASCULAR: S1, S2 normal. No murmurs, rubs, or gallops.  EXTREMITIES: No cyanosis, clubbing or edema b/l.    PSYCHIATRIC:  patient is lethargic   LABORATORY PANEL:  CBC Recent Labs  Lab 04/07/21 0324  WBC 13.8*  HGB 11.3*  HCT 35.4*  PLT 211    Chemistries  Recent Labs  Lab 04/04/21 0639 04/05/21 0429 04/07/21 0324  NA 143   < > 138  K 3.7   < > 3.1*  CL 101   < > 103  CO2 28   < > 28  GLUCOSE 148*   < > 93  BUN 16   < > 10  CREATININE 0.74   < > 0.47  CALCIUM 9.4   < > 8.1*  MG  --    < > 1.7  AST 20  --   --   ALT 11  --   --   ALKPHOS 101  --   --   BILITOT 0.9  --   --    < > = values in this interval not displayed.   Cardiac Enzymes No results for input(s): TROPONINI in the last 168 hours. RADIOLOGY:  DG Chest Port 1 View  Result Date: 04/07/2021 CLINICAL DATA:  Shortness of breath. EXAM: PORTABLE CHEST 1 VIEW COMPARISON:  04/05/2021 and CT chest 04/04/2021. FINDINGS:  Trachea is midline. Heart size stable. Thoracic aorta is calcified. Increasing volume loss in the lung bases. Developing airspace opacification in the right perihilar region. Difficult to exclude pleural fluid bilaterally. Elevated right hemidiaphragm. IMPRESSION: 1. Increasing bibasilar volume loss. 2. Developing right perihilar airspace opacification, difficult to exclude pneumonia. Electronically Signed   By: Molly Hawkins M.D.   On: 04/07/2021 08:49   ASSESSMENT AND PLAN:  85 y.o. female withPMHa. Fib, DVT, CVA (multiple embolic strokes 40/9811 with residual left arm weakness [facility]), HTN, HLD, carotid stenosis, osteoporosiswho presented to the ED from [facility] admitted with acute hypoxic respiratory failure requiring intubation and sepsis with septic shock due to pneumonia and UTI  Acute Hypoxic Respiratory Failure secondary toPneumonia, Aspiration,  S/p self extubation 4/23 - Supplemental O2 as needed to maintain O2 saturations 88 to 92%  Sepsis with septic shock due to Pneumonia and Urinary Tract Infection PMHx:Patient had 1/2 positive blood cultures growing staph epi and staphhominisat [facility] on 2/22, likely contaminant while admitted at [facility]. Repeat blood cultures from 2/23 negative thought to be mixed urogenital flora.  Patient was treated with CFTX1 g x 5 days (4/17-4/0)  Acute Metabolic Encephalopathy  Hx of underlying Dementia   History of seizures  Paroxysmal atrial fibrillation- Rate controlled  Diabetes mellitus Episode of hypoglycemia  History of multiple embolic infarct  Nutrition Status: Nutrition Problem: Severe Malnutrition Etiology: social / environmental circumstances (advanced age) Signs/Symptoms: severe fat depletion,severe muscle depletion Interventions: Refer to RD note for recommendations     Patient is transition to comfort care after palliative care discussion with daughter and family. Per Hospice RNno hospice beds available  today  CODE STATUS: DNR/DNI DVT Prophylaxis :comfort care Level of care: Med-Surg Status is: Inpatient  Patient is transition to comfort care. pt 2023/04/22 have hospital death.  discharge disposition likely to  [place] THIS PATIENT: *15* minutes.  >50% time spent on counselling and coordination of care  Note: This dictation was prepared with Dragon dictation along with smaller phrase technology. Any transcriptional errors that result from this process are unintentional.  Molly Hawkins M.D    [facility]   CC: Primary care physician; Molly Hawkins, MDPatient ID: Molly Hawkins, female   DOB: [birth year], 85 y.o.   MRN: [id number]

## 2021-04-08 NOTE — Care Management Important Message (Signed)
Important Message  Patient Details  Name: Molly Hawkins MRN: [id number] Date of Birth: [birth year]   Medicare Important Message Given:  Other (see comment)  Patient is on comfort care and out respect for the patient and family no Important Message from [facility] given.  [name] 04/08/2021, 7:19 AM

## 2021-04-08 NOTE — Progress Notes (Signed)
Daily Progress Note   Patient Name: Molly Hawkins       Date: 04/08/2021 DOB: [birth year]  Age: 85 y.o. MRN#: [id number] Attending Physician: Fritzi Mandes, MD Primary Care Physician: Gayland Curry, MD Admit Date: 04/04/2021  Reason for Consultation/Follow-up: Terminal Care  Subjective: Patient is resting in bed with eyes closed, no distress noted. Mittens have been removed. Even and unlabored respirations. No family at bedside. No changes to PRN symptom management orders recommended at this time. Awaiting hospice bed.    Length of Stay: 4  Current Medications: Scheduled Meds:  . chlorhexidine gluconate (MEDLINE KIT)  15 mL Mouth Rinse BID  . Chlorhexidine Gluconate Cloth  6 each Topical Q0600  . mouth rinse  15 mL Mouth Rinse 10 times per day  . mupirocin ointment  1 application Nasal BID    Continuous Infusions: . levETIRAcetam 750 mg (04/07/21 2142)    PRN Meds: acetaminophen **OR** acetaminophen, antiseptic oral rinse, docusate, glycopyrrolate **OR** glycopyrrolate **OR** glycopyrrolate, haloperidol **OR** haloperidol **OR** haloperidol lactate, ipratropium-albuterol, LORazepam **OR** LORazepam **OR** LORazepam, morphine injection, ondansetron **OR** ondansetron (ZOFRAN) IV, polyvinyl alcohol  Physical Exam Constitutional:      Comments: Eyes closed.   Pulmonary:     Effort: Pulmonary effort is normal.             Vital Signs: BP (!) 161/113 (BP Location: Right Arm)   Pulse 83   Temp 97.7 F (36.5 C)   Resp (!) 27   Ht 5' 5"  (1.651 m)   Wt 70.1 kg   SpO2 95%   BMI 25.72 kg/m  SpO2: SpO2: 95 % O2 Device: O2 Device: Room Air O2 Flow Rate: O2 Flow Rate (L/min): 4 L/min  Intake/output summary:   Intake/Output Summary (Last 24 hours) at 04/08/2021 1002 Last data  filed at 04/08/2021 8329 Gross per 24 hour  Intake 1472.12 ml  Output 1250 ml  Net 222.12 ml   LBM: Last BM Date: 04/05/21 Baseline Weight: Weight: 67.7 kg Most recent weight: Weight: 70.1 kg           Patient Active Problem List   Diagnosis Date Noted  . Acute respiratory distress   . Protein-calorie malnutrition, severe 04/05/2021  . Acute respiratory failure with hypoxia ([place]) 04/04/2021  . Pressure injury of skin 04/04/2021  . Hay fever 05/24/2015  .  Essential (primary) hypertension 05/24/2015  . Hypercholesteremia 05/24/2015  . B12 deficiency 05/24/2015  . Basal cell carcinoma of face 10/04/2014  . Broken humerus 04/03/2014  . Closed fracture of neck of right femur ([place]) 10/03/2013  . Blood glucose elevated 05/31/2013  . Carotid artery plaque 04/25/2013  . Vascular disorder of lower extremity 03/22/2012  . Long term current use of anticoagulant 02/22/2012  . Avitaminosis D 02/22/2012    Palliative Care Assessment & Plan    Recommendations/Plan: Waiting for hospice facility bed.     Code Status:    Code Status Orders  (From admission, onward)         Start     Ordered   04/07/21 1348  Do not attempt resuscitation (DNR)  Continuous       Question Answer Comment  In the event of cardiac or respiratory ARREST Do not call a "code blue"   In the event of cardiac or respiratory ARREST Do not perform Intubation, CPR, defibrillation or ACLS   In the event of cardiac or respiratory ARREST Use medication by any route, position, wound care, and other measures to relive pain and suffering. May use oxygen, suction and manual treatment of airway obstruction as needed for comfort.   Comments MOST form in [place]      04/07/21 1348        Code Status History    Date Active Date Inactive Code Status Order ID Comments User Context   04/05/2021 0904 04/07/2021 1348 DNR [id number]  Lang Snow, NP Inpatient   04/04/2021 0935 04/05/2021 0904 Full Code [id number]   Lang Snow, NP ED   Advance Care Planning Activity      Prognosis:  < 2 weeks    Thank you for allowing the Palliative Medicine Team to assist in the care of this patient.   Total Time 15 min Prolonged Time Billed no      Greater than 50%  of this time was spent counseling and coordinating care related to the above assessment and plan.  Asencion Gowda, NP  Please contact Palliative Medicine Team phone at [phone] for questions and concerns.

## 2021-04-08 NOTE — Progress Notes (Signed)
Nutrition Brief Note  Chart reviewed. Pt now transitioning to comfort care.  No further nutrition interventions planned at this time.  Please re-consult as needed.   Vikkie Goeden W, RD, LDN, CDCES Registered Dietitian II Certified Diabetes Care and Education Specialist Please refer to [facility] for RD and/or RD on-call/weekend/after hours pager   

## 2021-04-09 DIAGNOSIS — G9341 Metabolic encephalopathy: Secondary | ICD-10-CM

## 2021-04-09 DIAGNOSIS — N390 Urinary tract infection, site not specified: Secondary | ICD-10-CM

## 2021-04-09 DIAGNOSIS — R569 Unspecified convulsions: Secondary | ICD-10-CM

## 2021-04-09 DIAGNOSIS — J189 Pneumonia, unspecified organism: Secondary | ICD-10-CM

## 2021-04-09 LAB — CULTURE, BLOOD (ROUTINE X 2)
Culture: NO GROWTH
Culture: NO GROWTH

## 2021-04-09 NOTE — Progress Notes (Signed)
PROGRESS NOTE    Molly Hawkins  VWU:[id number] DOB: 1936-11-07 DOA: 04/04/2021 PCP: Gayland Curry, MD    Chief Complaint  Patient presents with  . Shortness of Breath    Brief Narrative:  85 y.o. female withPMHa. Fib, DVT, CVA (multiple embolic strokes 29/5621 with residual left arm weakness [facility]), HTN, HLD, carotid stenosis, osteoporosiswho presented to the ED from [facility] admitted with acute hypoxic respiratory failure requiring intubation and sepsis with septic shock due to pneumonia and UTI and placed on IV antibiotics.   Assessment & Plan:   Active Problems:   Acute respiratory failure with hypoxia (HCC)   Pressure injury of skin   Protein-calorie malnutrition, severe   Acute respiratory distress   Acute lower UTI   Pneumonia due to infectious organism   Seizures ([place])   Acute metabolic encephalopathy  #1 acute hypoxic respiratory failure secondary to pneumonia, aspiration requiring intubation.  Status post self extubation 04/05/2021. -Status post course of IV antibiotics. -Continue supplemental oxygen as needed to maintain sats between 88 to 92%. -Palliative care has met with family and decision made to transition to full comfort measures and awaiting residential hospice home placement.  2.  Sepsis with septic shock secondary to pneumonia and UTI -Patient noted during hospitalization at [facility] on 222 to have 1/2 positive blood cultures growing staph epi and staph hominis felt likely to be a contaminant during that hospitalization. -Repeat cultures from 223 were negative and thought to be mixed urogenital flora. -Status post Rocephin 1 g x 5 days during that hospitalization. -Patient pancultured with blood cultures negative x5 days. -Required pressors which have subsequently been discontinued.  Blood pressure stable. -Urine cultures with multiple species. -During this hospitalization has received a course of IV cefepime, IV azithromycin, IV  vancomycin. -Currently afebrile -Palliative care met with family and decision made to transition to full comfort measures.  3.  Acute metabolic encephalopathy -In the setting of underlying dementia. -Likely secondary to problem #2. -Likely close to baseline.  4.  Paroxysmal atrial fibrillation -Currently rate controlled.  Not on anticoagulation. -Patient has been transitioned to full comfort measures and awaiting residential hospice placement.  5.  History of seizures -Was on Keppra 500 mg twice daily. -Patient now transitioned to full comfort measures.  6.  Diabetes mellitus  7.  History of multiple embolic infarcts -Patient seen by palliative care and decision made to transition to full comfort measures.  Arranging residential hospice placement.  8.  Severe protein calorie malnutrition -Currently on full comfort measures.  9. Pressure injury Pressure Injury 04/04/21 Sacrum Mid Stage 2 -  Partial thickness loss of dermis presenting as a shallow open injury with a red, pink wound bed without slough. (Active)  04/04/21 1223  Location: Sacrum  Location Orientation: Mid  Staging: Stage 2 -  Partial thickness loss of dermis presenting as a shallow open injury with a red, pink wound bed without slough.  Wound Description (Comments):   Present on Admission: Yes         DVT prophylaxis: Comfort care Code Status: DNR Family Communication: Updated patient.  No family at bedside. Disposition:   Status is: Inpatient    Dispo: The patient is from: Home              Anticipated d/c is to: Residential hospice              Patient currently awaiting residential hospice placement.   Difficult to place patient no  Consultants:   Palliative care: Asencion Gowda, NP 04/07/2021  Admitted to PCCM/critical care  Procedures:   Chest x-ray 04/07/2021  CT abdomen and pelvis 04/04/2021  CT head CT C-spine 04/04/2021, 04/03/2021  CT angiogram chest  04/04/2021  Intubation 4/22 and self extubation 4/23  Antimicrobials:   IV azithromycin 04/04/2021>>>> 04/07/2021  IV cefepime 04/04/2021>>>>> 04/07/2021  IV vancomycin 04/04/2021>>>> 04/07/2021  Micro Data:  4/22: SARS-CoV-2 PCR>> negativ 4/22: Influenza PCR> negative 4/22: Blood culture x2>negative 4/22: UrineCx> contaminated 4/22: MRSA PCR>Positive 4/22:Strep pneumo urinary antigen > negative 4/22: Legionella urinary antigen> negative 4/22: Mycoplasma pneumonia> pending     Subjective: Sitting up in bed. No CP. No SOB. Stated drank ensure this morning.  Poor oral intake.  Appreciative of care she is receiving  Objective: Vitals:   04/08/21 0742 04/09/21 0350 04/09/21 1145 04/09/21 1520  BP: (!) 161/113 (!) 147/81 (!) 151/65 112/65  Pulse: 83 96 85 98  Resp: (!) 27 17 18 17   Temp: 97.7 F (36.5 C) 98.4 F (36.9 C) 98.3 F (36.8 C) 98.6 F (37 C)  TempSrc:  Oral Oral   SpO2:  99% 98% 97%  Weight:      Height:        Intake/Output Summary (Last 24 hours) at 04/09/2021 1942 Last data filed at 04/09/2021 0350 Gross per 24 hour  Intake --  Output 800 ml  Net -800 ml   Filed Weights   04/04/21 1145 04/05/21 0330 04/06/21 0500  Weight: 67.7 kg 70.1 kg 70.1 kg    Examination:  General exam: Appears calm and comfortable.  Respiratory system: Clear to auscultation anterior lung fields.  No wheezes, no crackles, no rhonchi.Marland Kitchen Respiratory effort normal. Cardiovascular system: S1 & S2 heard, RRR. No JVD, murmurs, rubs, gallops or clicks. No pedal edema. Gastrointestinal system: Abdomen is nondistended, soft and nontender. No organomegaly or masses felt. Normal bowel sounds heard. Central nervous system: Alert and oriented. No focal neurological deficits. Extremities: Symmetric 5 x 5 power. Skin: No rashes, lesions or ulcers Psychiatry: Judgement and insight appear normal. Mood & affect appropriate.     Data Reviewed: I have personally reviewed following labs and  imaging studies  CBC: Recent Labs  Lab 04/03/21 2038 04/04/21 0639 04/05/21 0429 04/06/21 0506 04/07/21 0324  WBC 11.1* 13.8* 24.8* 17.2* 13.8*  NEUTROABS 6.6 8.4*  --   --   --   HGB 13.8 13.7 11.6* 11.5* 11.3*  HCT 43.9 43.5 37.9 35.8* 35.4*  MCV 88.7 88.6 90.9 87.1 88.3  PLT 310 340 227 237 983    Basic Metabolic Panel: Recent Labs  Lab 04/03/21 2038 04/04/21 0639 04/05/21 0429 04/06/21 0506 04/07/21 0324  NA 143 143 140 140 138  K 3.8 3.7 3.3* 3.7 3.1*  CL 99 101 104 106 103  CO2 30 28 25 25 28   GLUCOSE 124* 148* 126* 82 93  BUN 14 16 17 15 10   CREATININE 0.72 0.74 0.77 0.59 0.47  CALCIUM 9.2 9.4 8.2* 8.3* 8.1*  MG  --   --  1.4* 2.1 1.7  PHOS  --   --  2.7 2.5 2.2*    GFR: Estimated Creatinine Clearance: 53.2 mL/min (by C-G formula based on SCr of 0.47 mg/dL).  Liver Function Tests: Recent Labs  Lab 04/04/21 0639  AST 20  ALT 11  ALKPHOS 101  BILITOT 0.9  PROT 7.3  ALBUMIN 3.0*    CBG: Recent Labs  Lab 04/06/21 2052 04/06/21 2343 04/07/21 0312 04/07/21 0754 04/07/21 1119  GLUCAP  84 98 89 76 83     Recent Results (from the past 240 hour(s))  Blood culture (routine x 2)     Status: None   Collection Time: 04/04/21  6:39 AM   Specimen: BLOOD  Result Value Ref Range Status   Specimen Description BLOOD BLOOD RIGHT HAND  Final   Special Requests   Final    BOTTLES DRAWN AEROBIC AND ANAEROBIC Blood Culture results may not be optimal due to an excessive volume of blood received in culture bottles   Culture   Final    NO GROWTH 5 DAYS Performed at [facility], [address], [place], [place] [address]    Report Status 04/09/2021 FINAL  Final  Urine culture     Status: Abnormal   Collection Time: 04/04/21  7:24 AM   Specimen: Urine, Random  Result Value Ref Range Status   Specimen Description   Final    URINE, RANDOM Performed at [facility], [address] [place], [place] [address]    Special Requests   Final     NONE Performed at [facility], [address], [place], [place] [address]    Culture MULTIPLE SPECIES PRESENT, SUGGEST RECOLLECTION (A)  Final   Report Status 04/05/2021 FINAL  Final  Blood culture (routine x 2)     Status: None   Collection Time: 04/04/21  7:55 AM   Specimen: BLOOD  Result Value Ref Range Status   Specimen Description BLOOD BLOOD RIGHT FOREARM  Final   Special Requests   Final    BOTTLES DRAWN AEROBIC AND ANAEROBIC Blood Culture results may not be optimal due to an inadequate volume of blood received in culture bottles   Culture   Final    NO GROWTH 5 DAYS Performed at [facility], [address] [place], [place] [address]    Report Status 04/09/2021 FINAL  Final  Resp Panel by RT-PCR (Flu A&B, Covid) Nasopharyngeal Swab     Status: None   Collection Time: 04/04/21  9:03 AM   Specimen: Nasopharyngeal Swab; Nasopharyngeal(NP) swabs in vial transport medium  Result Value Ref Range Status   SARS Coronavirus 2 by RT PCR NEGATIVE NEGATIVE Final    Comment: (NOTE) SARS-CoV-2 target nucleic acids are NOT DETECTED.  The SARS-CoV-2 RNA is generally detectable in upper respiratory specimens during the acute phase of infection. The lowest concentration of SARS-CoV-2 viral copies this assay can detect is 138 copies/mL. A negative result does not preclude SARS-Cov-2 infection and should not be used as the sole basis for treatment or other patient management decisions. A negative result may occur with  improper specimen collection/handling, submission of specimen other than nasopharyngeal swab, presence of viral mutation(s) within the areas targeted by this assay, and inadequate number of viral copies(<138 copies/mL). A negative result must be combined with clinical observations, patient history, and epidemiological information. The expected result is Negative.  Fact Sheet for Patients:  EntrepreneurPulse.com.au  Fact Sheet for  Healthcare Providers:  IncredibleEmployment.be  This test is no t yet approved or cleared by the [place] FDA and  has been authorized for detection and/or diagnosis of SARS-CoV-2 by FDA under an Emergency Use Authorization (EUA). This EUA will remain  in effect (meaning this test can be used) for the duration of the COVID-19 declaration under Section 564(b)(1) of the Act, 21 U.S.C.section 360bbb-3(b)(1), unless the authorization is terminated  or revoked sooner.       Influenza A by PCR NEGATIVE NEGATIVE  Final   Influenza B by PCR NEGATIVE NEGATIVE Final    Comment: (NOTE) The Xpert Xpress SARS-CoV-2/FLU/RSV plus assay is intended as an aid in the diagnosis of influenza from Nasopharyngeal swab specimens and should not be used as a sole basis for treatment. Nasal washings and aspirates are unacceptable for Xpert Xpress SARS-CoV-2/FLU/RSV testing.  Fact Sheet for Patients: EntrepreneurPulse.com.au  Fact Sheet for Healthcare Providers: IncredibleEmployment.be  This test is not yet approved or cleared by the [place] FDA and has been authorized for detection and/or diagnosis of SARS-CoV-2 by FDA under an Emergency Use Authorization (EUA). This EUA will remain in effect (meaning this test can be used) for the duration of the COVID-19 declaration under Section 564(b)(1) of the Act, 21 U.S.C. section 360bbb-3(b)(1), unless the authorization is terminated or revoked.  Performed at [facility], [address] [place], [place] [address]   MRSA PCR Screening     Status: Abnormal   Collection Time: 04/04/21  1:00 PM   Specimen: Nasal Mucosa; Nasopharyngeal  Result Value Ref Range Status   MRSA by PCR POSITIVE (A) NEGATIVE Final    Comment:        The GeneXpert MRSA Assay (FDA approved for NASAL specimens only), is one component of a comprehensive MRSA colonization surveillance program. It is  not intended to diagnose MRSA infection nor to guide or monitor treatment for MRSA infections. RESULT CALLED TO, READ BACK BY AND VERIFIED WITH: MARIA WITHROW AT 4445 ON 04/04/2021 [place]. Performed at [facility], [address], [place], [place] [address]   Urine Culture     Status: None   Collection Time: 04/06/21  8:58 AM   Specimen: Urine, Random  Result Value Ref Range Status   Specimen Description   Final    URINE, RANDOM Performed at [facility], [address], [place], [place] [address]    Special Requests   Final    NONE Performed at [facility], [address], [place], [place] [address]    Culture   Final    NO GROWTH Performed at [facility], [place] [address], [place], [place] [address]    Report Status 04/07/2021 FINAL  Final         Radiology Studies: No results found.      Scheduled Meds: Continuous Infusions:   LOS: 5 days    Time spent: 35 minutes    Irine Seal, MD [facility]   To contact the attending provider between 7A-7P or the covering provider during after hours 7P-7A, please log into the web site [email or web address] and access using universal [place] password for that web site. If you do not have the password, please call the hospital operator.  04/09/2021, 7:42 PM

## 2021-04-09 NOTE — Progress Notes (Signed)
Daily Progress Note   Patient Name: Molly Hawkins       Date: 04/09/2021 DOB: [birth year]  Age: 85 y.o. MRN#: [id number] Attending Physician: Eugenie Filler, MD Primary Care Physician: Gayland Curry, MD Admit Date: 04/04/2021  Reason for Consultation/Follow-up: Terminal Care  Subjective: Patient is resting in bed with hospice liaisons at bedside. Patient opens eyes partially upon speaking with her. She denies pain or discomfort. Per hospice, patient does not have a bed available at hospice today; she is stable at this time for D/C to hospice. No family at bedside. No changes to PRN comfort medications made today.     Length of Stay: 5  Current Medications: Scheduled Meds:      PRN Meds: acetaminophen **OR** acetaminophen, antiseptic oral rinse, docusate, glycopyrrolate **OR** glycopyrrolate **OR** glycopyrrolate, haloperidol **OR** haloperidol **OR** haloperidol lactate, ipratropium-albuterol, LORazepam **OR** LORazepam **OR** LORazepam, morphine injection, ondansetron **OR** ondansetron (ZOFRAN) IV, polyvinyl alcohol  Physical Exam Pulmonary:     Effort: Pulmonary effort is normal.  Skin:    General: Skin is warm and dry.  Neurological:     Mental Status: She is alert.             Vital Signs: BP (!) 151/65 (BP Location: Right Arm)   Pulse 85   Temp 98.3 F (36.8 C) (Oral)   Resp 18   Ht 5\' 5"  (1.651 m)   Wt 70.1 kg   SpO2 98%   BMI 25.72 kg/m  SpO2: SpO2: 98 % O2 Device: O2 Device: Nasal Cannula O2 Flow Rate: O2 Flow Rate (L/min): 2 L/min  Intake/output summary:   Intake/Output Summary (Last 24 hours) at 04/09/2021 1356 Last data filed at 04/09/2021 0350 Gross per 24 hour  Intake --  Output 800 ml  Net -800 ml   LBM: Last BM Date: 04/05/21 Baseline  Weight: Weight: 67.7 kg Most recent weight: Weight: 70.1 kg           Patient Active Problem List   Diagnosis Date Noted  . Acute respiratory distress   . Protein-calorie malnutrition, severe 04/05/2021  . Acute respiratory failure with hypoxia ([place]) 04/04/2021  . Pressure injury of skin 04/04/2021  . Hay fever 05/24/2015  . Essential (primary) hypertension 05/24/2015  . Hypercholesteremia 05/24/2015  . B12 deficiency 05/24/2015  . Basal cell carcinoma of face  10/04/2014  . Broken humerus 04/03/2014  . Closed fracture of neck of right femur ([place]) 10/03/2013  . Blood glucose elevated 05/31/2013  . Carotid artery plaque 04/25/2013  . Vascular disorder of lower extremity 03/22/2012  . Long term current use of anticoagulant 02/22/2012  . Avitaminosis D 02/22/2012    Palliative Care Assessment & Plan   Recommendations/Plan: Waiting for hospice bed.     Code Status:    Code Status Orders  (From admission, onward)         Start     Ordered   04/07/21 1348  Do not attempt resuscitation (DNR)  Continuous       Question Answer Comment  In the event of cardiac or respiratory ARREST Do not call a "code blue"   In the event of cardiac or respiratory ARREST Do not perform Intubation, CPR, defibrillation or ACLS   In the event of cardiac or respiratory ARREST Use medication by any route, position, wound care, and other measures to relive pain and suffering. May use oxygen, suction and manual treatment of airway obstruction as needed for comfort.   Comments MOST form in [place]      04/07/21 1348        Code Status History    Date Active Date Inactive Code Status Order ID Comments User Context   04/05/2021 0904 04/07/2021 1348 DNR [id number]  Lang Snow, NP Inpatient   04/04/2021 0935 04/05/2021 0904 Full Code [id number]  Lang Snow, NP ED   Advance Care Planning Activity      Prognosis:  < 2 weeks    Care plan was discussed with hospice  Thank  you for allowing the Palliative Medicine Team to assist in the care of this patient.   Total Time 15 min Prolonged Time Billed  no       Greater than 50%  of this time was spent counseling and coordinating care related to the above assessment and plan.  Asencion Gowda, NP  Please contact Palliative Medicine Team phone at [phone] for questions and concerns.

## 2021-04-09 NOTE — Progress Notes (Signed)
[facility]) Hospital Liaison RN note:  Unfortunately, [facility] is not able to offer a room today. Hospital care team is aware. Spoke with daughter, Malachy Mood over the phone to provide update. [facility] Liaison will continue to follow for room availability.  Please call with any hospice related questions or concerns.  Thank you for the opportunity to participate in this patient's care.  Zandra Abts, RN [facility] Liaison [phone]

## 2021-04-10 MED ORDER — LEVETIRACETAM 100 MG/ML PO SOLN
750.0000 mg | Freq: Two times a day (BID) | ORAL | Status: DC
  Administered 2021-04-10 – 2021-04-11 (×2): 750 mg via ORAL
  Filled 2021-04-10 (×3): qty 7.5

## 2021-04-10 NOTE — Evaluation (Addendum)
Clinical/Bedside Hawkins Evaluation Patient Details  Name: Molly Hawkins MRN: [id number] Date of Birth: 12/21/1935  Today's Date: 04/10/2021 Time: SLP Start Time (ACUTE ONLY): 2 SLP Stop Time (ACUTE ONLY): 1330 SLP Time Calculation (min) (ACUTE ONLY): 60 min  Past Medical History:  Past Medical History:  Diagnosis Date  . Arthritis   . Diabetes mellitus without complication ([place])   . DVT (deep venous thrombosis) ([place])   . Hyperlipidemia   . Ulcer of foot [facility])    Past Surgical History:  Past Surgical History:  Procedure Laterality Date  . ABDOMINAL HYSTERECTOMY  1967  . BLADDER SURGERY  1960s  . BREAST BIOPSY Left    abcess drained under arm  . broken ankle  1985  . broken arm Left 2015  . CATARACT EXTRACTION  [id number]  . PARTIAL HIP ARTHROPLASTY  2014  . URETERAL EXPLORATION  1960s   removed stricture/scar tissue   HPI:  Pt is a 85 y.o. female with PMH a. Fib, DVT, CVA (multiple embolic strokes 12/6008 with residual left arm weakness [facility]), HTN, HLD, carotid stenosis, osteoporosis who [facility] with complaint of shortness of breath.  On review of her chart, patient initially presented to the ED yesterday  4/21 with concerns of unwitnessed fall and complaint of right chest sided chest pain.  She was apparently found lying face down next to her bed.  When EMS arrived patient was still lying facedown conscious but confused.  Chest x-ray which showed no acute osseous injury or pneumothorax.  Head CT and cervical spine did not show any acute abnormality other than an indeterminant CVA but daughter deferred MRI.  Urine however was concerning for UTI therefore patient was given a dose of fosfomycin and discharged back to the facility.  On arrival to the ED this day, 4/22, per report, patient was found with sats in the low 60s.  Upon EMS arrival, placed on BiPAP.  She was noted to be unresponsive with a GCS of 6 and therefore she was intubated for  airway protection.  Repeat CT head and cervical spine was obtained which showed no acute intracranial abnormality or evidence of acute cervical spine fracture, traumatic subluxation or static signs of instability.  CT angio chest shows no evidence of pulmonary embolism however a right lower lobe opacity concerning for pneumonia was noted, CT abdomen pelvis also showed no acute abnormality chronic right pelvic fractures was noted.  UA positive for UTI.  Concerns for possible sepsis this admit.   Assessment / Plan / Recommendation Clinical Impression  Pt was awake, alert to follow through w/ tasks, and verbally engaged w/ this SLP and her Granddaughter in a calm and appropriate manner. Min confusion but redirectable to task. However, noted pt had increased Phlegm secretions Prior to po's; educated pt/family on Coughing and Clearing the Phlegm in throat/airway BEFORE attempting to eat/drink. Pt appeared weak and min shaky in her UEs but was strong to Hold Cup to self-feed when drinking.   She appears to present w/ grossly adequate oropharyngeal phase Hawkins w/ No overt oropharyngeal phase dysphagia noted, No neuromuscular deficits noted. Pt consumed po trials w/ No overt, clinical s/s of aspiration during po trials except x1 when she took multiple sips of thin liquids quickly because "it was good". Pt acknowledged she drank too fast. Pt appears at reduced risk for aspiration following general aspiration precautions. However, noted pt had increased Phlegm secretions Prior to po's; educated pt/family on Coughing and Clearing  the Phlegm in throat/airway BEFORE attempting to eat/drink.  During po trials, pt consumed all consistencies w/out overt coughing(all trials except the one), no decline in vocal quality, or change in respiratory presentation during/post trials. Oral phase appeared [facility] w/ timely bolus management, mastication, and control of bolus propulsion for A-P transfer for swallowing. Oral clearing  achieved w/ all trial consistencies given min more Time w/ the solid trials. OM Exam appeared [facility] w/ no unilateral weakness noted. Speech Clear. Pt fed self w/ setup support - Held Cup to Drink.  Recommend a more Mech Soft/Regular consistency diet(for her pleasure too) w/ well-Cut meats, moistened foods; Thin liquids. Recommend general aspiration precautions, Pills whole vs crushed in Puree for safer, easier swallowing. Education given on Pills in Puree; food consistencies and easy to eat options; general aspiration precautions and ways to support pt during the meal for conservation of energy. NSG to reconsult if any new needs arise. NSG agreed. SLP Visit Diagnosis: Dysphagia, unspecified (R13.10)    Aspiration Risk  Mild aspiration risk;Risk for inadequate nutrition/hydration (reduced following precautions)    Diet Recommendation  Mech Soft/Regular diet w/ meats/foods cut small, moistened. Thin liquids. General aspiration precautions. Support at meals w/ feeding d/t weakness -- pt can Hold Cup to drink.   Medication Administration: Whole meds with puree (vs need to Crush)    Other  Recommendations Recommended Consults:  (Dietician; Palliative Care) Oral Care Recommendations: Oral care BID;Oral care before and after PO;Staff/trained caregiver to provide oral care Other Recommendations:  (n/a)   Follow up Recommendations None      Frequency and Duration  (n/a)   (n/a)       Prognosis Prognosis for Safe Diet Advancement: Fair (-Good) Barriers to Reach Goals: Time post onset;Severity of deficits      Hawkins Study   General Date of Onset: 04/04/21 HPI: Pt is a 85 y.o. female with PMH a. Fib, DVT, CVA (multiple embolic strokes 16/1096 with residual left arm weakness [facility]), HTN, HLD, carotid stenosis, osteoporosis who [facility] with complaint of shortness of breath.  On review of her chart, patient initially presented to the ED yesterday  4/21 with  concerns of unwitnessed fall and complaint of right chest sided chest pain.  She was apparently found lying face down next to her bed.  When EMS arrived patient was still lying facedown conscious but confused.  Chest x-ray which showed no acute osseous injury or pneumothorax.  Head CT and cervical spine did not show any acute abnormality other than an indeterminant CVA but daughter deferred MRI.  Urine however was concerning for UTI therefore patient was given a dose of fosfomycin and discharged back to the facility.  On arrival to the ED this day, 4/22, per report, patient was found with sats in the low 60s.  Upon EMS arrival, placed on BiPAP.  She was noted to be unresponsive with a GCS of 6 and therefore she was intubated for airway protection.  Repeat CT head and cervical spine was obtained which showed no acute intracranial abnormality or evidence of acute cervical spine fracture, traumatic subluxation or static signs of instability.  CT angio chest shows no evidence of pulmonary embolism however a right lower lobe opacity concerning for pneumonia was noted, CT abdomen pelvis also showed no acute abnormality chronic right pelvic fractures was noted.  UA positive for UTI.  Concerns for possible sepsis this admit. Type of Study: Bedside Hawkins Evaluation Previous Hawkins Assessment: none Diet  Prior to this Study: Thin liquids (clear liquid diet ordered today per MD) Temperature Spikes Noted: No Respiratory Status: Nasal cannula (2L) History of Recent Intubation: Yes Length of Intubations (days): 2 days Date extubated: 04/05/21 Behavior/Cognition: Alert;Cooperative;Pleasant mood;Confused;Distractible;Requires cueing Oral Cavity Assessment: Within Functional Limits Oral Care Completed by SLP: Recent completion by staff Oral Cavity - Dentition: Adequate natural dentition Vision: Functional for self-feeding Self-Feeding Abilities: Able to feed self;Needs assist;Needs set up;Total assist (support d/t  weakness) Patient Positioning: Upright in bed (needed positioning) Baseline Vocal Quality: Normal Volitional Cough: Strong Volitional Hawkins: Able to elicit    Oral/Motor/Sensory Function Overall Oral Motor/Sensory Function: Within functional limits   Ice Chips Ice chips: Not tested   Thin Liquid Thin Liquid: Within functional limits Presentation: Straw;Self Fed (10+ trials) Other Comments: coughed x1 when she took multiple sips of tea quickly -- "it was good"    Nectar Thick Nectar Thick Liquid: Not tested   Honey Thick Honey Thick Liquid: Not tested   Puree Puree: Within functional limits Presentation: Spoon (fed; 5+ trials)   Solid     Solid: Within functional limits (grossly; 2 trials) Presentation: Spoon (fed) Other Comments: liked the ice cream       Orinda Kenner, MS, [employer] Speech Language Pathologist Rehab Services [phone] Taevin Mcferran 04/10/2021,3:13 PM

## 2021-04-10 NOTE — Progress Notes (Addendum)
PROGRESS NOTE    Molly Hawkins  IAX:[id number] DOB: [birth year] DOA: 04/04/2021 PCP: Gayland Curry, MD    Chief Complaint  Patient presents with  . Shortness of Breath    Brief Narrative:  85 y.o. female withPMHa. Fib, DVT, CVA (multiple embolic strokes 06/8674 with residual left arm weakness [facility]), HTN, HLD, carotid stenosis, osteoporosiswho presented [facility] the [facility] with acute hypoxic respiratory failure requiring intubation and sepsis with septic shock due to pneumonia and UTI and placed on IV antibiotics.   Assessment & Plan:   Active Problems:   Acute respiratory failure with hypoxia (HCC)   Pressure injury of skin   Protein-calorie malnutrition, severe   Acute respiratory distress   Acute lower UTI   Pneumonia due to infectious organism   Seizures (HCC)   Acute metabolic encephalopathy  1 acute hypoxic respiratory failure secondary to pneumonia, aspiration requiring intubation.  Status post self extubation 04/05/2021. -Status post course of IV antibiotics. -Continue supplemental oxygen as needed to maintain sats between 88 to 92%. -Palliative care has met with family and decision made to transition to full comfort measures and awaiting residential hospice home placement.  2.  Sepsis with septic shock secondary to pneumonia and UTI -Patient noted during hospitalization at [facility] on 222 to have 1/2 positive blood cultures growing staph epi and staph hominis felt likely to be a contaminant during that hospitalization. -Repeat cultures from 223 were negative and thought to be mixed urogenital flora. -Status post Rocephin 1 g x 5 days during that hospitalization. -Patient pancultured with blood cultures negative x5 days. -Required pressors which have subsequently been discontinued.  Blood pressure stable. -Urine cultures with multiple species. -During this hospitalization has received a course of IV cefepime, IV azithromycin, IV  vancomycin. -Currently afebrile -Palliative care met with family and decision made to transition to full comfort measures.  3.  Acute metabolic encephalopathy -In the setting of underlying dementia. -Likely secondary to problem #2. -Likely close to baseline.  4.  Paroxysmal atrial fibrillation -Currently rate controlled.  Not on anticoagulation. -Patient has been transitioned to full comfort measures and awaiting residential hospice placement.  5.  History of seizures -Was on Keppra 750 mg twice daily. -Patient now transitioned to full comfort measures. -Resume home regimen Keppra 750 mg twice daily.  6.  Diabetes mellitus  7.  History of multiple embolic infarcts -Patient seen by palliative care and decision made to transition to full comfort measures.   -Awaiting residential hospice placement.    8.  Severe protein calorie malnutrition -Currently on full comfort measures.   -SLP reevaluated patient and patient started on a diet.    9. Pressure injury Pressure Injury 04/04/21 Sacrum Mid Stage 2 -  Partial thickness loss of dermis presenting as a shallow open injury with a red, pink wound bed without slough. (Active)  04/04/21 1223  Location: Sacrum  Location Orientation: Mid  Staging: Stage 2 -  Partial thickness loss of dermis presenting as a shallow open injury with a red, pink wound bed without slough.  Wound Description (Comments):   Present on Admission: Yes         DVT prophylaxis: Comfort care Code Status: DNR Family Communication: Updated patient.  No family at bedside. Disposition:   Status is: Inpatient    Dispo: The patient is from: Home              Anticipated d/c is to: Residential hospice  Patient currently awaiting residential hospice placement.   Difficult to place patient no       Consultants:   Palliative care: Asencion Gowda, NP 04/07/2021  Admitted to PCCM/critical care  Procedures:   Chest x-ray 04/07/2021  CT  abdomen and pelvis 04/04/2021  CT head CT C-spine 04/04/2021, 04/03/2021  CT angiogram chest 04/04/2021  Intubation 4/22 and self extubation 4/23  Antimicrobials:   IV azithromycin 04/04/2021>>>> 04/07/2021  IV cefepime 04/04/2021>>>>> 04/07/2021  IV vancomycin 04/04/2021>>>> 04/07/2021  Micro Data:  4/22: SARS-CoV-2 PCR>> negativ 4/22: Influenza PCR> negative 4/22: Blood culture x2>negative 4/22: UrineCx> contaminated 4/22: MRSA PCR>Positive 4/22:Strep pneumo urinary antigen > negative 4/22: Legionella urinary antigen> negative 4/22: Mycoplasma pneumonia> pending     Subjective: Laying in bed arousable.  Denies chest pain or shortness of breath.  No abdominal pain.  Patient with poor oral intake.    Objective: Vitals:   04/09/21 0350 04/09/21 1145 04/09/21 1520 04/10/21 0549  BP: (!) 147/81 (!) 151/65 112/65 125/87  Pulse: 96 85 98 88  Resp: 17 18 17 18   Temp: 98.4 F (36.9 C) 98.3 F (36.8 C) 98.6 F (37 C) 98.4 F (36.9 C)  TempSrc: Oral Oral  Oral  SpO2: 99% 98% 97% 98%  Weight:      Height:        Intake/Output Summary (Last 24 hours) at 04/10/2021 1801 Last data filed at 04/10/2021 1018 Gross per 24 hour  Intake 0 ml  Output --  Net 0 ml   Filed Weights   04/04/21 1145 04/05/21 0330 04/06/21 0500  Weight: 67.7 kg 70.1 kg 70.1 kg    Examination:  General exam: : NAD.  Frail. Respiratory system: CTA B anterior lung fields.  No wheezes, no rhonchi.  Speaking in full sentences.  Normal respiratory effort. Cardiovascular system: Regular rate and rhythm no murmurs rubs or gallops.  No JVD.  No lower extremity edema.  Gastrointestinal system: Abdomen soft, nontender, nondistended, positive bowel sounds.  No rebound.  No guarding. Central nervous system: Alert and oriented. No focal neurological deficits. Extremities: Symmetric 5 x 5 power. Skin: No rashes, lesions or ulcers Psychiatry: Judgement and insight appear fair. Mood & affect  appropriate.    Data Reviewed: I have personally reviewed following labs and imaging studies  CBC: Recent Labs  Lab 04/03/21 2038 04/04/21 0639 04/05/21 0429 04/06/21 0506 04/07/21 0324  WBC 11.1* 13.8* 24.8* 17.2* 13.8*  NEUTROABS 6.6 8.4*  --   --   --   HGB 13.8 13.7 11.6* 11.5* 11.3*  HCT 43.9 43.5 37.9 35.8* 35.4*  MCV 88.7 88.6 90.9 87.1 88.3  PLT 310 340 227 237 409    Basic Metabolic Panel: Recent Labs  Lab 04/03/21 2038 04/04/21 0639 04/05/21 0429 04/06/21 0506 04/07/21 0324  NA 143 143 140 140 138  K 3.8 3.7 3.3* 3.7 3.1*  CL 99 101 104 106 103  CO2 30 28 25 25 28   GLUCOSE 124* 148* 126* 82 93  BUN 14 16 17 15 10   CREATININE 0.72 0.74 0.77 0.59 0.47  CALCIUM 9.2 9.4 8.2* 8.3* 8.1*  MG  --   --  1.4* 2.1 1.7  PHOS  --   --  2.7 2.5 2.2*    GFR: Estimated Creatinine Clearance: 53.2 mL/min (by C-G formula based on SCr of 0.47 mg/dL).  Liver Function Tests: Recent Labs  Lab 04/04/21 0639  AST 20  ALT 11  ALKPHOS 101  BILITOT 0.9  PROT 7.3  ALBUMIN 3.0*  CBG: Recent Labs  Lab 04/06/21 2052 04/06/21 2343 04/07/21 0312 04/07/21 0754 04/07/21 1119  GLUCAP 84 98 89 76 83     Recent Results (from the past 240 hour(s))  Blood culture (routine x 2)     Status: None   Collection Time: 04/04/21  6:39 AM   Specimen: BLOOD  Result Value Ref Range Status   Specimen Description BLOOD BLOOD RIGHT HAND  Final   Special Requests   Final    BOTTLES DRAWN AEROBIC AND ANAEROBIC Blood Culture results may not be optimal due to an excessive volume of blood received in culture bottles   Culture   Final    NO GROWTH 5 DAYS Performed at [facility], [address], [place], [place] [address]    Report Status 04/09/2021 FINAL  Final  Urine culture     Status: Abnormal   Collection Time: 04/04/21  7:24 AM   Specimen: Urine, Random  Result Value Ref Range Status   Specimen Description   Final    URINE, RANDOM Performed at [facility], [address], [place], [place] [address]    Special Requests   Final    NONE Performed at [facility], [address] [place], [place] [address]    Culture MULTIPLE SPECIES PRESENT, SUGGEST RECOLLECTION (A)  Final   Report Status 04/05/2021 FINAL  Final  Blood culture (routine x 2)     Status: None   Collection Time: 04/04/21  7:55 AM   Specimen: BLOOD  Result Value Ref Range Status   Specimen Description BLOOD BLOOD RIGHT FOREARM  Final   Special Requests   Final    BOTTLES DRAWN AEROBIC AND ANAEROBIC Blood Culture results may not be optimal due to an inadequate volume of blood received in culture bottles   Culture   Final    NO GROWTH 5 DAYS Performed at [facility], [address] [place],  [address]    Report Status 04/09/2021 FINAL  Final  Resp Panel by RT-PCR (Flu A&B, Covid) Nasopharyngeal Swab     Status: None   Collection Time: 04/04/21  9:03 AM   Specimen: Nasopharyngeal Swab; Nasopharyngeal(NP) swabs in vial transport medium  Result Value Ref Range Status   SARS Coronavirus 2 by RT PCR NEGATIVE NEGATIVE Final    Comment: (NOTE) SARS-CoV-2 target nucleic acids are NOT DETECTED.  The SARS-CoV-2 RNA is generally detectable in upper respiratory specimens during the acute phase of infection. The lowest concentration of SARS-CoV-2 viral copies this assay can detect is 138 copies/mL. A negative result does not preclude SARS-Cov-2 infection and should not be used as the sole basis for treatment or other patient management decisions. A negative result may occur with  improper specimen collection/handling, submission of specimen other than nasopharyngeal swab, presence of viral mutation(s) within the areas targeted by this assay, and inadequate number of viral copies(<138 copies/mL). A negative result must be combined with clinical observations, patient history, and epidemiological information. The expected result is Negative.  Fact  Sheet for Patients:  EntrepreneurPulse.com.au  Fact Sheet for Healthcare Providers:  IncredibleEmployment.be  This test is no t yet approved or cleared by the [place] FDA and  has been authorized for detection and/or diagnosis of SARS-CoV-2 by FDA under an Emergency Use Authorization (EUA). This EUA will remain  in effect (meaning this test can be used) for the duration of the COVID-19 declaration under Section 564(b)(1) of the Act, 21 U.S.C.section 360bbb-3(b)(1), unless the authorization is  terminated  or revoked sooner.       Influenza A by PCR NEGATIVE NEGATIVE Final   Influenza B by PCR NEGATIVE NEGATIVE Final    Comment: (NOTE) The Xpert Xpress SARS-CoV-2/FLU/RSV plus assay is intended as an aid in the diagnosis of influenza from Nasopharyngeal swab specimens and should not be used as a sole basis for treatment. Nasal washings and aspirates are unacceptable for Xpert Xpress SARS-CoV-2/FLU/RSV testing.  Fact Sheet for Patients: EntrepreneurPulse.com.au  Fact Sheet for Healthcare Providers: IncredibleEmployment.be  This test is not yet approved or cleared by the [place] FDA and has been authorized for detection and/or diagnosis of SARS-CoV-2 by FDA under an Emergency Use Authorization (EUA). This EUA will remain in effect (meaning this test can be used) for the duration of the COVID-19 declaration under Section 564(b)(1) of the Act, 21 U.S.C. section 360bbb-3(b)(1), unless the authorization is terminated or revoked.  Performed at [facility], [address] [place], [place] [address]   MRSA PCR Screening     Status: Abnormal   Collection Time: 04/04/21  1:00 PM   Specimen: Nasal Mucosa; Nasopharyngeal  Result Value Ref Range Status   MRSA by PCR POSITIVE (A) NEGATIVE Final    Comment:        The GeneXpert MRSA Assay (FDA approved for NASAL specimens only), is one  component of a comprehensive MRSA colonization surveillance program. It is not intended to diagnose MRSA infection nor to guide or monitor treatment for MRSA infections. RESULT CALLED TO, READ BACK BY AND VERIFIED WITH: MARIA WITHROW AT 8550 ON 04/04/2021 [place]. Performed at [facility], [address], [place], [place] [address]   Urine Culture     Status: None   Collection Time: 04/06/21  8:58 AM   Specimen: Urine, Random  Result Value Ref Range Status   Specimen Description   Final    URINE, RANDOM Performed at [facility], [address], [place], [place] [address]    Special Requests   Final    NONE Performed at [facility], [address], [place], [place] [address]    Culture   Final    NO GROWTH Performed at [facility], [place] [address] [place],  [address]    Report Status 04/07/2021 FINAL  Final         Radiology Studies: No results found.      Scheduled Meds: Continuous Infusions:   LOS: 6 days    Time spent: 35 minutes    Irine Seal, MD [facility]   To contact the attending provider between 7A-7P or the covering provider during after hours 7P-7A, please log into the web site [email or web address] and access using universal [place] password for that web site. If you do not have the password, please call the hospital operator.  04/10/2021, 6:01 PM

## 2021-04-10 NOTE — Progress Notes (Signed)
[facility]) Hospital Liaison RN note:  Visited patient at bedside. Spoke with daughter, Malachy Mood over the phone to provide update. Unfortunately, [facility] is not able to offer a room today. Hospital care team is aware. [place] Liaison will continue to follow for room availability.  Please call with any hospice related questions or concerns.  Thank you for the opportunity to participate in this patient's care.  Zandra Abts, RN [facility] Liaison  [phone]

## 2021-04-11 MED ORDER — HALOPERIDOL 0.5 MG PO TABS: 0.5000 mg | ORAL_TABLET | ORAL | 0 refills | Status: AC | PRN

## 2021-04-11 MED ORDER — GLYCOPYRROLATE 1 MG PO TABS: 1.0000 mg | ORAL_TABLET | ORAL | 0 refills | Status: AC | PRN

## 2021-04-11 MED ORDER — LORAZEPAM 2 MG/ML PO CONC: 1.0000 mg | ORAL | 0 refills | Status: AC | PRN

## 2021-04-11 MED ORDER — IPRATROPIUM-ALBUTEROL 0.5-2.5 (3) MG/3ML IN SOLN: 3.0000 mL | Freq: Four times a day (QID) | RESPIRATORY_TRACT | 0 refills | Status: AC | PRN

## 2021-04-11 MED ORDER — POLYVINYL ALCOHOL 1.4 % OP SOLN: 1.0000 [drp] | Freq: Four times a day (QID) | OPHTHALMIC | 0 refills | Status: AC | PRN

## 2021-04-11 NOTE — Care Management Important Message (Signed)
Important Message  Patient Details  Name: Molly Hawkins MRN: [id number] Date of Birth: 04-27-1936   Medicare Important Message Given:  Other (see comment)  Patient is comfort care and awaiting a bed at the [facility]. Out of respect for the patient and family no Important Message from [facility] given.  Juliann Pulse A Yuvonne Lanahan 04/11/2021, 8:15 AM

## 2021-04-11 NOTE — Progress Notes (Signed)
Called and gave reported to [facility] to Miguel Rota, RN

## 2021-04-11 NOTE — Discharge Summary (Signed)
Physician Discharge Summary  STORMI VANDEVELDE LID:[id number] DOB: 1936-09-12 DOA: 04/04/2021  PCP: Gayland Curry, MD  Admit date: 04/04/2021 Discharge date: 04/11/2021  Time spent: 50 minutes  Recommendations for Outpatient Follow-up:  1. Patient to be discharged to residential hospice home. 2. Follow-up with MD at residential hospice home.   Discharge Diagnoses:  Active Problems:   Acute respiratory failure with hypoxia (HCC)   Pressure injury of skin   Protein-calorie malnutrition, severe   Acute respiratory distress   Acute lower UTI   Pneumonia due to infectious organism   Seizures ([place])   Acute metabolic encephalopathy   Discharge Condition: Stable  Diet recommendation: Regular diet  Filed Weights   04/04/21 1145 04/05/21 0330 04/06/21 0500  Weight: 67.7 kg 70.1 kg 70.1 kg    History of present illness:  HPI per Dr. Mortimer Fries 85 y.o. female with PMH a. Fib, DVT, CVA (multiple embolic strokes 88/7579 with residual left arm weakness [facility]), HTN, HLD, carotid stenosis, osteoporosis who [facility] with complaint of shortness of breath.  On review of her chart, patient initially presented to the ED yesterday  4/21 with concerns of unwitnessed fall and complaint of right chest sided chest pain.  She was apparently found lying face down next to her bed.  When EMS arrived patient was still lying facedown conscious but confused.  EMS reported vital signs: 120/76, p 102, R 18, O2 sat 98% on 2L via nasal canula. A  full work-up was done including chest x-ray which showed no acute osseous injury or pneumothorax.  Head CT and cervical spine did not show any acute abnormality other than an indeterminant CVA but daughter deferred MRI.  Urine however was concerning for UTI therefore patient was given a dose of fosfomycin and discharged back to the facility.  On arrival to the ED today 4/22, she was afebrile with blood pressure 138/63 mm Hg and pulse  rate 117-121 beats/min. Per ED report, patient was found with sats in the low 60s upon EMS arrival and placed on BiPAP.  She was noted to be unresponsive with a GCS of 6 and therefore she was intubated for airway protection.  Initial pertinent labs revealed glucose 148, wbc13.8, lactic acid of 3.7 otherwise unremarkable lab work.  Repeat CT head and cervical spine was obtained which showed no acute intracranial abnormality or evidence of acute cervical spine fracture, traumatic subluxation or static signs of instability.  CT angio chest shows no evidence of pulmonary embolism however a right lower lobe opacity concerning for pneumonia was noted, CT abdomen pelvis also showed no acute abnormality chronic right pelvic fractures was noted.  UA positive for UTI.  Patient required vasopressors upon intubation due to concerns for possible sepsis code sepsis was initiated and patient started on empiric antibiotics with cefepime, vancomycin and metronidazole.  PCCM consulted for further management.   Hospital Course:  1 acute hypoxic respiratory failure secondary to pneumonia, aspiration requiring intubation.  Status post self extubation 04/05/2021. -Status post course of IV antibiotics. -Continue supplemental oxygen as needed to maintain sats between 88 to 92%. -Palliative care has met with family and decision made to transition to full comfort measures and awaiting residential hospice home placement.  2.  Sepsis with septic shock secondary to pneumonia and UTI -Patient noted during hospitalization at [place] on 222 to have 1/2 positive blood cultures growing staph epi and staph hominis felt likely to be a contaminant during that hospitalization. -Repeat cultures from  223 were negative and thought to be mixed urogenital flora. -Status post Rocephin 1 g x 5 days during that hospitalization. -Patient pancultured with blood cultures negative x5 days. -Required pressors which have subsequently been discontinued.   Blood pressure stable. -Urine cultures with multiple species. -During this hospitalization has received a course of IV cefepime, IV azithromycin, IV vancomycin. -Patient remained afebrile. -Palliative care met with family and decision made to transition to full comfort measures.  3.  Acute metabolic encephalopathy -In the setting of underlying dementia. -Likely secondary to problem #2. -Likely close to baseline.  4.  Paroxysmal atrial fibrillation -Remained rate controlled.  Not on anticoagulation. -Patient has been transitioned to full comfort measures and will be discharged to residential hospice home.  5.  History of seizures -Was on Keppra 750 mg twice daily. -Patient transitioned to full comfort measures. -Keppra initially discontinued but resumed during this hospitalization which patient will be discharged on. -Patient to be discharged to residential hospice home.  6.  Diabetes mellitus  7.  History of multiple embolic infarcts -Patient seen by palliative care and decision made to transition to full comfort measures.   -Patient will be discharged to residential hospice home.  8.  Severe protein calorie malnutrition -Patient transition to full comfort measures.   -SLP reevaluated patient and patient started on a regular diet which she tolerated diet.    9. Pressure injury Pressure Injury 04/04/21 Sacrum Mid Stage 2 -  Partial thickness loss of dermis presenting as a shallow open injury with a red, pink wound bed without slough. (Active)  04/04/21 1223  Location: Sacrum  Location Orientation: Mid  Staging: Stage 2 -  Partial thickness loss of dermis presenting as a shallow open injury with a red, pink wound bed without slough.  Wound Description (Comments):   Present on Admission: Yes      Procedures:  Chest x-ray 04/07/2021  CT abdomen and pelvis 04/04/2021  CT head CT C-spine 04/04/2021, 04/03/2021  CT angiogram chest 04/04/2021  Intubation 4/22 and self  extubation 4/23   Consultations:  Palliative care: Asencion Gowda, NP 04/07/2021  Admitted to PCCM/critical care    Discharge Exam: Vitals:   04/11/21 0601 04/11/21 0733  BP: 140/62 (!) 148/60  Pulse: 94 93  Resp: 18 18  Temp: 98.3 F (36.8 C) 98.3 F (36.8 C)  SpO2: 98% 97%    General: NAD Cardiovascular: RRR Respiratory: CTAB anterior lung fields  Discharge Instructions   Discharge Instructions    Diet general   Complete by: As directed    Discharge wound care:   Complete by: As directed    AS ABOVE   Increase activity slowly   Complete by: As directed      Allergies as of 04/11/2021      Reactions   Atorvastatin    Other reaction(s): Unknown   Codeine    Stomach problems   Ezetimibe    Other reaction(s): Unknown   Statins    Other reaction(s): Unknown      Medication List    STOP taking these medications   atorvastatin 40 MG tablet Commonly known as: LIPITOR   Eliquis 5 MG Tabs tablet Generic drug: apixaban   QUEtiapine 25 MG tablet Commonly known as: SEROQUEL     TAKE these medications   acetaminophen 650 MG suppository Commonly known as: TYLENOL Place 650 mg rectally every 6 (six) hours as needed for fever.   acetaminophen 325 MG tablet Commonly known as: TYLENOL Take 650 mg by mouth  every 6 (six) hours as needed.   Ensure Take 237 mLs by mouth daily with lunch.   glycopyrrolate 1 MG tablet Commonly known as: ROBINUL Take 1 tablet (1 mg total) by mouth every 4 (four) hours as needed (excessive secretions).   haloperidol 0.5 MG tablet Commonly known as: HALDOL Take 1 tablet (0.5 mg total) by mouth every 4 (four) hours as needed for agitation (or delirium).   ipratropium-albuterol 0.5-2.5 (3) MG/3ML Soln Commonly known as: DUONEB Take 3 mLs by nebulization every 6 (six) hours as needed.   levETIRAcetam 750 MG tablet Commonly known as: KEPPRA Take 750 mg by mouth 2 (two) times daily.   LORazepam 2 MG/ML concentrated  solution Commonly known as: ATIVAN Place 0.5 mLs (1 mg total) under the tongue every 4 (four) hours as needed for anxiety.   metoprolol tartrate 25 MG tablet Commonly known as: LOPRESSOR Take 12.5 mg by mouth 2 (two) times daily.   ondansetron 4 MG disintegrating tablet Commonly known as: ZOFRAN-ODT Take 4 mg by mouth every 6 (six) hours as needed for nausea or vomiting.   polyvinyl alcohol 1.4 % ophthalmic solution Commonly known as: LIQUIFILM TEARS Place 1 drop into both eyes 4 (four) times daily as needed for dry eyes.   Tussin DM 10-100 MG/5ML liquid Generic drug: dextromethorphan-guaiFENesin Take 10 mLs by mouth every 6 (six) hours as needed for cough.            Discharge Care Instructions  (From admission, onward)         Start     Ordered   04/11/21 0000  Discharge wound care:       Comments: AS ABOVE   04/11/21 1307         Allergies  Allergen Reactions  . Atorvastatin     Other reaction(s): Unknown  . Codeine     Stomach problems  . Ezetimibe     Other reaction(s): Unknown  . Statins     Other reaction(s): Unknown    Follow-up Information    MD AT [facility] Follow up.                The results of significant diagnostics from this hospitalization (including imaging, microbiology, ancillary and laboratory) are listed below for reference.    Significant Diagnostic Studies: DG Ribs Unilateral W/Chest Right  Result Date: 04/03/2021 CLINICAL DATA:  Unwitnessed fall. EXAM: RIGHT RIBS AND CHEST - 3+ VIEW COMPARISON:  Chest x-ray 04/15/2016 FINDINGS: The cardiac silhouette, mediastinal and hilar contours are within normal limits given the AP projection and supine position of the patient. There is tortuosity and calcification of the thoracic aorta. Significant overlying artifact but no obvious pulmonary infiltrates, pleural effusions or pneumothorax. IMPRESSION: Significant overlying artifact but no obvious pulmonary infiltrates or pleural effusions.  Electronically Signed   By: Marijo Sanes M.D.   On: 04/03/2021 21:06   DG Abd 1 View  Result Date: 04/04/2021 CLINICAL DATA:  OG tube placement EXAM: ABDOMEN - 1 VIEW COMPARISON:  CT 04/04/2021 FINDINGS: Airspace disease at the right lung base. Esophageal tube tip overlies the mid stomach, side-port overlies the proximal stomach. Faint contrast within the renal collecting systems. Upper gas pattern is unremarkable IMPRESSION: Esophageal tube tip overlies the mid stomach. Airspace disease at the right base Electronically Signed   By: Donavan Foil M.D.   On: 04/04/2021 21:53   CT Head Wo Contrast  Result Date: 04/04/2021 CLINICAL DATA:  Unwitnessed fall. Found down and unresponsive. Neck trauma. EXAM:  CT HEAD WITHOUT CONTRAST CT CERVICAL SPINE WITHOUT CONTRAST TECHNIQUE: Multidetector CT imaging of the head and cervical spine was performed following the standard protocol without intravenous contrast. Multiplanar CT image reconstructions of the cervical spine were also generated. COMPARISON:  Prior CTs 04/03/2021. FINDINGS: CT HEAD FINDINGS Brain: There is no evidence of acute intracranial hemorrhage, mass lesion, brain edema or extra-axial fluid collection. There is stable mild atrophy with prominence of the ventricles and subarachnoid spaces. There are chronic small vessel ischemic changes in the periventricular white matter and a nonacute infarct in the left posterior parietal lobe which is unchanged. There is no CT evidence of acute cortical infarction. Vascular: Prominent intracranial vascular calcifications. No hyperdense vessel identified. Skull: Negative for fracture or focal lesion. Sinuses/Orbits: The visualized paranasal sinuses and mastoid air cells are clear. No orbital abnormalities are seen. Other: None. CT CERVICAL SPINE FINDINGS Alignment: Physiologic. Skull base and vertebrae: No evidence of acute fracture or traumatic subluxation. Soft tissues and spinal canal: No prevertebral fluid or  swelling. No visible canal hematoma. Disc levels: Stable multilevel spondylosis, greatest at C5-6 and C6-7. Associated mild to moderate foraminal narrowing. No large disc herniation identified. Upper chest: Patient is intubated, and an enteric tube is in place. Emphysematous changes at both lung apices and bilateral carotid atherosclerosis are noted. Other: None IMPRESSION: 1. No acute findings or significant changes are seen compared with the studies of the previous evening. 2. Atrophy with chronic small vessel ischemic changes and non acute posterior left parietal infarct. 3. No evidence of acute cervical spine fracture, traumatic subluxation or static signs of instability. 4. Multilevel cervical spondylosis. Electronically Signed   By: Richardean Sale M.D.   On: 04/04/2021 09:27   CT Head Wo Contrast  Result Date: 04/03/2021 CLINICAL DATA:  Unwitnessed fall EXAM: CT HEAD WITHOUT CONTRAST TECHNIQUE: Contiguous axial images were obtained from the base of the skull through the vertex without intravenous contrast. COMPARISON:  04/15/2016 FINDINGS: Brain: There is a posterior left parietal infarct, age indeterminate but new since 2017. Chronic small vessel disease throughout the deep white matter. No hemorrhage or hydrocephalus. Vascular: No hyperdense vessel or unexpected calcification. Skull: No acute calvarial abnormality. Sinuses/Orbits: No acute findings Other: None IMPRESSION: Age-indeterminate posterior left parietal infarct. Chronic small vessel disease. Electronically Signed   By: Rolm Baptise M.D.   On: 04/03/2021 21:22   CT Angio Chest PE W and/or Wo Contrast  Result Date: 04/04/2021 CLINICAL DATA:  Shortness of breath, abdominal distension EXAM: CT ANGIOGRAPHY CHEST CT ABDOMEN AND PELVIS WITH CONTRAST TECHNIQUE: Multidetector CT imaging of the chest was performed using the standard protocol during bolus administration of intravenous contrast. Multiplanar CT image reconstructions and MIPs were  obtained to evaluate the vascular anatomy. Multidetector CT imaging of the abdomen and pelvis was performed using the standard protocol during bolus administration of intravenous contrast. CONTRAST:  28m OMNIPAQUE IOHEXOL 350 MG/ML SOLN COMPARISON:  CTA chest dated 08/31/2013 FINDINGS: CTA CHEST FINDINGS Cardiovascular: Satisfactory opacification of the bilateral pulmonary arteries to the segmental level. No evidence of pulmonary embolism. Although not tailored for evaluation of the thoracic aorta, there is no evidence of thoracic aortic aneurysm or dissection. Atherosclerotic calcifications of the aortic arch. The heart is normal in size.  No pericardial effusion. Three vessel coronary atherosclerosis. Mediastinum/Nodes: No suspicious mediastinal lymphadenopathy. Visualized thyroid is unremarkable. Lungs/Pleura: Endotracheal tube terminates 1.2 cm above the carina. Patchy right lower lobe opacity with volume loss, favoring lobar pneumonia, although superimposed atelectasis is also possible. Mild centrilobular and  paraseptal emphysematous changes, upper lung predominant. No suspicious pulmonary nodules. No pleural effusion or pneumothorax. Musculoskeletal: Degenerative changes of the thoracic spine. Review of the MIP images confirms the above findings. CT ABDOMEN and PELVIS FINDINGS Hepatobiliary: Liver is within normal limits. Gallbladder is unremarkable. No intrahepatic or extrahepatic ductal dilatation. Pancreas: Within normal limits. Spleen: Within normal limits. Adrenals/Urinary Tract: Adrenal glands are within normal limits. Subcentimeter bilateral renal cysts.  No hydronephrosis. Bladder is decompressed by indwelling Foley catheter and obscured by streak artifact. Stomach/Bowel: Enteric tube terminates in the proximal gastric body. No evidence of bowel obstruction. Normal appendix (series 2/image 46). Sigmoid diverticulosis, without evidence of diverticulitis. Vascular/Lymphatic: No evidence of abdominal  aortic aneurysm. Atherosclerotic calcifications of the abdominal aorta and branch vessels. No suspicious abdominopelvic lymphadenopathy. Reproductive: Status post hysterectomy. No adnexal masses. Other: No abdominopelvic ascites. Musculoskeletal: Old/healing right sacral, right superior pubic ramus/parasymphyseal, and right inferior pubic ramus fractures (series 2/images 63, 83, 86, and 91). Mild lumbar dextroscoliosis with moderate degenerative changes of the visualized thoracolumbar spine. Right hip arthroplasty, without evidence of complication. Review of the MIP images confirms the above findings. IMPRESSION: No evidence of pulmonary embolism. Right lower lobe opacity, favoring pneumonia, less likely atelectasis. Endotracheal tube terminates 1.2 cm above the carina. Enteric tube terminates in the proximal gastric body. Old/healing right pelvic fractures, as above. Electronically Signed   By: Julian Hy M.D.   On: 04/04/2021 09:28   CT Cervical Spine Wo Contrast  Result Date: 04/04/2021 CLINICAL DATA:  Unwitnessed fall. Found down and unresponsive. Neck trauma. EXAM: CT HEAD WITHOUT CONTRAST CT CERVICAL SPINE WITHOUT CONTRAST TECHNIQUE: Multidetector CT imaging of the head and cervical spine was performed following the standard protocol without intravenous contrast. Multiplanar CT image reconstructions of the cervical spine were also generated. COMPARISON:  Prior CTs 04/03/2021. FINDINGS: CT HEAD FINDINGS Brain: There is no evidence of acute intracranial hemorrhage, mass lesion, brain edema or extra-axial fluid collection. There is stable mild atrophy with prominence of the ventricles and subarachnoid spaces. There are chronic small vessel ischemic changes in the periventricular white matter and a nonacute infarct in the left posterior parietal lobe which is unchanged. There is no CT evidence of acute cortical infarction. Vascular: Prominent intracranial vascular calcifications. No hyperdense vessel  identified. Skull: Negative for fracture or focal lesion. Sinuses/Orbits: The visualized paranasal sinuses and mastoid air cells are clear. No orbital abnormalities are seen. Other: None. CT CERVICAL SPINE FINDINGS Alignment: Physiologic. Skull base and vertebrae: No evidence of acute fracture or traumatic subluxation. Soft tissues and spinal canal: No prevertebral fluid or swelling. No visible canal hematoma. Disc levels: Stable multilevel spondylosis, greatest at C5-6 and C6-7. Associated mild to moderate foraminal narrowing. No large disc herniation identified. Upper chest: Patient is intubated, and an enteric tube is in place. Emphysematous changes at both lung apices and bilateral carotid atherosclerosis are noted. Other: None IMPRESSION: 1. No acute findings or significant changes are seen compared with the studies of the previous evening. 2. Atrophy with chronic small vessel ischemic changes and non acute posterior left parietal infarct. 3. No evidence of acute cervical spine fracture, traumatic subluxation or static signs of instability. 4. Multilevel cervical spondylosis. Electronically Signed   By: Richardean Sale M.D.   On: 04/04/2021 09:27   CT Cervical Spine Wo Contrast  Result Date: 04/03/2021 CLINICAL DATA:  85 year old female with fall. EXAM: CT CERVICAL SPINE WITHOUT CONTRAST TECHNIQUE: Multidetector CT imaging of the cervical spine was performed without intravenous contrast. Multiplanar CT image reconstructions were  also generated. COMPARISON:  None. FINDINGS: Alignment: No acute subluxation. Skull base and vertebrae: No acute fracture. Soft tissues and spinal canal: No prevertebral fluid or swelling. No visible canal hematoma. Disc levels:  Multilevel degenerative changes. Upper chest: Negative. Other: Bilateral carotid bulb calcified plaques. IMPRESSION: No acute/traumatic cervical spine pathology. Electronically Signed   By: Anner Crete M.D.   On: 04/03/2021 21:21   CT ABDOMEN PELVIS  W CONTRAST  Result Date: 04/04/2021 CLINICAL DATA:  Shortness of breath, abdominal distension EXAM: CT ANGIOGRAPHY CHEST CT ABDOMEN AND PELVIS WITH CONTRAST TECHNIQUE: Multidetector CT imaging of the chest was performed using the standard protocol during bolus administration of intravenous contrast. Multiplanar CT image reconstructions and MIPs were obtained to evaluate the vascular anatomy. Multidetector CT imaging of the abdomen and pelvis was performed using the standard protocol during bolus administration of intravenous contrast. CONTRAST:  46m OMNIPAQUE IOHEXOL 350 MG/ML SOLN COMPARISON:  CTA chest dated 08/31/2013 FINDINGS: CTA CHEST FINDINGS Cardiovascular: Satisfactory opacification of the bilateral pulmonary arteries to the segmental level. No evidence of pulmonary embolism. Although not tailored for evaluation of the thoracic aorta, there is no evidence of thoracic aortic aneurysm or dissection. Atherosclerotic calcifications of the aortic arch. The heart is normal in size.  No pericardial effusion. Three vessel coronary atherosclerosis. Mediastinum/Nodes: No suspicious mediastinal lymphadenopathy. Visualized thyroid is unremarkable. Lungs/Pleura: Endotracheal tube terminates 1.2 cm above the carina. Patchy right lower lobe opacity with volume loss, favoring lobar pneumonia, although superimposed atelectasis is also possible. Mild centrilobular and paraseptal emphysematous changes, upper lung predominant. No suspicious pulmonary nodules. No pleural effusion or pneumothorax. Musculoskeletal: Degenerative changes of the thoracic spine. Review of the MIP images confirms the above findings. CT ABDOMEN and PELVIS FINDINGS Hepatobiliary: Liver is within normal limits. Gallbladder is unremarkable. No intrahepatic or extrahepatic ductal dilatation. Pancreas: Within normal limits. Spleen: Within normal limits. Adrenals/Urinary Tract: Adrenal glands are within normal limits. Subcentimeter bilateral renal cysts.   No hydronephrosis. Bladder is decompressed by indwelling Foley catheter and obscured by streak artifact. Stomach/Bowel: Enteric tube terminates in the proximal gastric body. No evidence of bowel obstruction. Normal appendix (series 2/image 46). Sigmoid diverticulosis, without evidence of diverticulitis. Vascular/Lymphatic: No evidence of abdominal aortic aneurysm. Atherosclerotic calcifications of the abdominal aorta and branch vessels. No suspicious abdominopelvic lymphadenopathy. Reproductive: Status post hysterectomy. No adnexal masses. Other: No abdominopelvic ascites. Musculoskeletal: Old/healing right sacral, right superior pubic ramus/parasymphyseal, and right inferior pubic ramus fractures (series 2/images 63, 83, 86, and 91). Mild lumbar dextroscoliosis with moderate degenerative changes of the visualized thoracolumbar spine. Right hip arthroplasty, without evidence of complication. Review of the MIP images confirms the above findings. IMPRESSION: No evidence of pulmonary embolism. Right lower lobe opacity, favoring pneumonia, less likely atelectasis. Endotracheal tube terminates 1.2 cm above the carina. Enteric tube terminates in the proximal gastric body. Old/healing right pelvic fractures, as above. Electronically Signed   By: SJulian HyM.D.   On: 04/04/2021 09:28   DG Chest Port 1 View  Result Date: 04/07/2021 CLINICAL DATA:  Shortness of breath. EXAM: PORTABLE CHEST 1 VIEW COMPARISON:  04/05/2021 and CT chest 04/04/2021. FINDINGS: Trachea is midline. Heart size stable. Thoracic aorta is calcified. Increasing volume loss in the lung bases. Developing airspace opacification in the right perihilar region. Difficult to exclude pleural fluid bilaterally. Elevated right hemidiaphragm. IMPRESSION: 1. Increasing bibasilar volume loss. 2. Developing right perihilar airspace opacification, difficult to exclude pneumonia. Electronically Signed   By: MLorin PicketM.D.   On: 04/07/2021 08:49   DG  Chest Port 1 View  Result Date: 04/05/2021 CLINICAL DATA:  Evaluate endotracheal tube placement EXAM: PORTABLE CHEST 1 VIEW COMPARISON:  04/04/2021 FINDINGS: ETT tip is stable above the carina. There is a nasogastric tube with tip and side port below GE junction. Asymmetric elevation of right hemidiaphragm is again noted. Overlying atelectasis and airspace disease is unchanged. IMPRESSION: Persistent asymmetric elevation of right hemidiaphragm with overlying atelectasis and pneumonia Electronically Signed   By: Kerby Moors M.D.   On: 04/05/2021 07:54   DG Chest Port 1 View  Result Date: 04/04/2021 CLINICAL DATA:  Encounter for intubation EXAM: PORTABLE CHEST 1 VIEW COMPARISON:  Yesterday FINDINGS: New endotracheal tube with tip just below the clavicular heads. The enteric tube tip and side-port reaches the stomach. New infiltrate at the right lung base with elevated diaphragm. No edema, effusion, or pneumothorax. Normal heart size. IMPRESSION: 1. New hardware in unremarkable position. 2. New atelectasis at the right base. Aspiration or pneumonia may be coexistent. Electronically Signed   By: Monte Fantasia M.D.   On: 04/04/2021 07:13   EEG adult  Result Date: 04/04/2021 Lora Havens, MD     04/04/2021  9:06 PM Patient Name: Molly Hawkins MRN: [id number] Epilepsy Attending: Lora Havens Referring Physician/Provider: Rufina Falco, NP Date: 04/04/2021 Duration: 30.23 mins Patient history: 85yo F with h/o seizures with possible seizure. EEG to evaluate for seizure Level of alertness: comatose AEDs during EEG study: LEV, propofol, versed Technical aspects: This EEG study was done with scalp electrodes positioned according to the 10-20 International system of electrode placement. Electrical activity was acquired at a sampling rate of 500Hz  and reviewed with a high frequency filter of 70Hz  and a low frequency filter of 1Hz . EEG data were recorded continuously and digitally stored. Description: EEG  showed continuous generalized polymorphic 3 to 6 Hz theta-delta slowing. Hyperventilation and photic stimulation were not performed.   ABNORMALITY - Continuous slow, generalized IMPRESSION: This study is suggestive of severe diffuse encephalopathy, nonspecific etiology. No seizures or epileptiform discharges were seen throughout the recording. Lora Havens    Microbiology: Recent Results (from the past 240 hour(s))  Blood culture (routine x 2)     Status: None   Collection Time: 04/04/21  6:39 AM   Specimen: BLOOD  Result Value Ref Range Status   Specimen Description BLOOD BLOOD RIGHT HAND  Final   Special Requests   Final    BOTTLES DRAWN AEROBIC AND ANAEROBIC Blood Culture results may not be optimal due to an excessive volume of blood received in culture bottles   Culture   Final    NO GROWTH 5 DAYS Performed at [facility], [address], [place], [place] [address]    Report Status 04/09/2021 FINAL  Final  Urine culture     Status: Abnormal   Collection Time: 04/04/21  7:24 AM   Specimen: Urine, Random  Result Value Ref Range Status   Specimen Description   Final    URINE, RANDOM Performed at [facility], [address], [place], [place] [address]    Special Requests   Final    NONE Performed at [facility], [address] [place], [place] [address]    Culture MULTIPLE SPECIES PRESENT, SUGGEST RECOLLECTION (A)  Final   Report Status 04/05/2021 FINAL  Final  Blood culture (routine x 2)     Status: None   Collection Time: 04/04/21  7:55 AM   Specimen: BLOOD  Result Value Ref Range Status  Specimen Description BLOOD BLOOD RIGHT FOREARM  Final   Special Requests   Final    BOTTLES DRAWN AEROBIC AND ANAEROBIC Blood Culture results may not be optimal due to an inadequate volume of blood received in culture bottles   Culture   Final    NO GROWTH 5 DAYS Performed at [facility], [address] [place], [place] [address]     Report Status 04/09/2021 FINAL  Final  Resp Panel by RT-PCR (Flu A&B, Covid) Nasopharyngeal Swab     Status: None   Collection Time: 04/04/21  9:03 AM   Specimen: Nasopharyngeal Swab; Nasopharyngeal(NP) swabs in vial transport medium  Result Value Ref Range Status   SARS Coronavirus 2 by RT PCR NEGATIVE NEGATIVE Final    Comment: (NOTE) SARS-CoV-2 target nucleic acids are NOT DETECTED.  The SARS-CoV-2 RNA is generally detectable in upper respiratory specimens during the acute phase of infection. The lowest concentration of SARS-CoV-2 viral copies this assay can detect is 138 copies/mL. A negative result does not preclude SARS-Cov-2 infection and should not be used as the sole basis for treatment or other patient management decisions. A negative result may occur with  improper specimen collection/handling, submission of specimen other than nasopharyngeal swab, presence of viral mutation(s) within the areas targeted by this assay, and inadequate number of viral copies(<138 copies/mL). A negative result must be combined with clinical observations, patient history, and epidemiological information. The expected result is Negative.  Fact Sheet for Patients:  EntrepreneurPulse.com.au  Fact Sheet for Healthcare Providers:  IncredibleEmployment.be  This test is no t yet approved or cleared by the [place] FDA and  has been authorized for detection and/or diagnosis of SARS-CoV-2 by FDA under an Emergency Use Authorization (EUA). This EUA will remain  in effect (meaning this test can be used) for the duration of the COVID-19 declaration under Section 564(b)(1) of the Act, 21 U.S.C.section 360bbb-3(b)(1), unless the authorization is terminated  or revoked sooner.       Influenza A by PCR NEGATIVE NEGATIVE Final   Influenza B by PCR NEGATIVE NEGATIVE Final    Comment: (NOTE) The Xpert Xpress SARS-CoV-2/FLU/RSV plus assay is intended as an aid in  the diagnosis of influenza from Nasopharyngeal swab specimens and should not be used as a sole basis for treatment. Nasal washings and aspirates are unacceptable for Xpert Xpress SARS-CoV-2/FLU/RSV testing.  Fact Sheet for Patients: EntrepreneurPulse.com.au  Fact Sheet for Healthcare Providers: IncredibleEmployment.be  This test is not yet approved or cleared by the [place] FDA and has been authorized for detection and/or diagnosis of SARS-CoV-2 by FDA under an Emergency Use Authorization (EUA). This EUA will remain in effect (meaning this test can be used) for the duration of the COVID-19 declaration under Section 564(b)(1) of the Act, 21 U.S.C. section 360bbb-3(b)(1), unless the authorization is terminated or revoked.  Performed at [facility], [address] [place],  [address]   MRSA PCR Screening     Status: Abnormal   Collection Time: 04/04/21  1:00 PM   Specimen: Nasal Mucosa; Nasopharyngeal  Result Value Ref Range Status   MRSA by PCR POSITIVE (A) NEGATIVE Final    Comment:        The GeneXpert MRSA Assay (FDA approved for NASAL specimens only), is one component of a comprehensive MRSA colonization surveillance program. It is not intended to diagnose MRSA infection nor to guide or monitor treatment for MRSA infections. RESULT CALLED TO, READ BACK BY AND VERIFIED WITH: Darden Amber AT  1438 ON 04/04/2021 [place]. Performed at [facility], [address], [place], [place] [address]   Urine Culture     Status: None   Collection Time: 04/06/21  8:58 AM   Specimen: Urine, Random  Result Value Ref Range Status   Specimen Description   Final    URINE, RANDOM Performed at [facility], [address], [place], [place] [address]    Special Requests   Final    NONE Performed at [facility], [address], [place], [place] [address]    Culture   Final    NO GROWTH Performed at  [facility], [address], [place], [place] [address]    Report Status 04/07/2021 FINAL  Final     Labs: Basic Metabolic Panel: Recent Labs  Lab 04/05/21 0429 04/06/21 0506 04/07/21 0324  NA 140 140 138  K 3.3* 3.7 3.1*  CL 104 106 103  CO2 25 25 28   GLUCOSE 126* 82 93  BUN 17 15 10   CREATININE 0.77 0.59 0.47  CALCIUM 8.2* 8.3* 8.1*  MG 1.4* 2.1 1.7  PHOS 2.7 2.5 2.2*   Liver Function Tests: No results for input(s): AST, ALT, ALKPHOS, BILITOT, PROT, ALBUMIN in the last 168 hours. No results for input(s): LIPASE, AMYLASE in the last 168 hours. No results for input(s): AMMONIA in the last 168 hours. CBC: Recent Labs  Lab 04/05/21 0429 04/06/21 0506 04/07/21 0324  WBC 24.8* 17.2* 13.8*  HGB 11.6* 11.5* 11.3*  HCT 37.9 35.8* 35.4*  MCV 90.9 87.1 88.3  PLT 227 237 211   Cardiac Enzymes: No results for input(s): CKTOTAL, CKMB, CKMBINDEX, TROPONINI in the last 168 hours. BNP: BNP (last 3 results) Recent Labs    04/04/21 0639  BNP 69.8    ProBNP (last 3 results) No results for input(s): PROBNP in the last 8760 hours.  CBG: Recent Labs  Lab 04/06/21 2052 04/06/21 2343 04/07/21 0312 04/07/21 0754 04/07/21 1119  GLUCAP 84 98 89 76 83       Signed:  Irine Seal MD.  [facility] 04/11/2021, 1:08 PM

## 2021-04-11 NOTE — Progress Notes (Signed)
[facility]) [facility] Liaison Note  Hospice Home is able to offer a bed today. RN reached out to patient's daughter and TOC to notify. Daughter will sign consents via Docu-Sign this am and plan will be for early afternoon transport.   Please call report to [phone] and assure that signed DNR goes with patient.   Please call with any Hospice related questions or concerns.   Thank you for allowing [place] to participate in this patient's care.   Jhonnie Garner, Therapist, sports, [facility] Liaison [phone]

## 2021-04-11 NOTE — TOC Transition Note (Signed)
Transition of Care [facility]) - CM/SW Discharge Note   Patient Details  Name: DANARIA LARSEN MRN: [id number] Date of Birth: 1936-08-26  [facility] CM/SW Contact:  Shelbie Hutching, RN Phone Number: 04/11/2021, 10:18 AM   Clinical Narrative:    Patient will discharge to the [facility] in [place] today.  [facility] has been arranged for transport at 2 PM.     Final next level of care: [place] Barriers to Discharge: Barriers Resolved   Patient Goals and CMS Choice Patient states their goals for this hospitalization and ongoing recovery are:: Residential hospice CMS Medicare.gov Compare Post Acute Care list provided to:: Patient Represenative (must comment) Choice offered to / list presented to : Adult Children  Discharge Placement              Patient chooses bed at: Other - please specify in the comment section below: [facility]) Patient to be transferred to facility by: [facility] Name of family member notified: Malachy Mood - daughter Patient and family notified of of transfer: 04/11/21  Discharge Plan and Services In-house Referral: Clinical Social Work   Post Acute Care Choice: [place]          DME Arranged: N/A DME Agency: NA       HH Arranged: NA          Social Determinants of Health (SDOH) Interventions     Readmission Risk Interventions No flowsheet data found.

## 2021-04-17 LAB — MYCOPLASMA PNEUMONIAE CULTURE

## 2021-04-22 LAB — BLOOD GAS, ARTERIAL
Acid-Base Excess: 2.6 mmol/L — ABNORMAL HIGH (ref 0.0–2.0)
Bicarbonate: 26.2 mmol/L (ref 20.0–28.0)
FIO2: 0.6
MECHVT: 400 mL
O2 Saturation: 99.4 %
PEEP: 5 cmH2O
Patient temperature: 37
RATE: 18 resp/min
pCO2 arterial: 36 mmHg (ref 32.0–48.0)
pH, Arterial: 7.47 — ABNORMAL HIGH (ref 7.350–7.450)
pO2, Arterial: 148 mmHg — ABNORMAL HIGH (ref 83.0–108.0)

## 2021-05-14 DEATH — deceased
